# Patient Record
Sex: Female | Born: 1990 | Race: Black or African American | Hispanic: No | Marital: Single | Smoking: Never smoker
Health system: Southern US, Community
[De-identification: ages and names within clinical notes are randomized; demographics above are authoritative.]

## PROBLEM LIST (undated history)

## (undated) DIAGNOSIS — D649 Anemia, unspecified: Secondary | ICD-10-CM

## (undated) DIAGNOSIS — G43909 Migraine, unspecified, not intractable, without status migrainosus: Secondary | ICD-10-CM

## (undated) DIAGNOSIS — D219 Benign neoplasm of connective and other soft tissue, unspecified: Secondary | ICD-10-CM

## (undated) HISTORY — PX: NO PAST SURGERIES: SHX2092

---
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
## 2013-05-14 ENCOUNTER — Emergency Department (INDEPENDENT_AMBULATORY_CARE_PROVIDER_SITE_OTHER)
Admission: EM | Admit: 2013-05-14 | Discharge: 2013-05-14 | Disposition: A | Discharge status: Home / Self Care | Payer: 59 | Source: Home / Self Care | Attending: Emergency Medicine | Admitting: Emergency Medicine

## 2013-05-14 ENCOUNTER — Encounter (HOSPITAL_COMMUNITY): Payer: Self-pay | Admitting: Emergency Medicine

## 2013-05-14 DIAGNOSIS — G43909 Migraine, unspecified, not intractable, without status migrainosus: Secondary | ICD-10-CM | POA: Diagnosis not present

## 2013-05-14 MED ORDER — KETOROLAC TROMETHAMINE 30 MG/ML IJ SOLN
30.0000 mg | Freq: Once | INTRAMUSCULAR | Status: AC
  Administered 2013-05-14: 30 mg via INTRAVENOUS

## 2013-05-14 MED ORDER — SODIUM CHLORIDE 0.9 % IJ SOLN
INTRAMUSCULAR | Status: AC
  Filled 2013-05-14: qty 10

## 2013-05-14 MED ORDER — METOCLOPRAMIDE HCL 5 MG/ML IJ SOLN
INTRAMUSCULAR | Status: AC
  Filled 2013-05-14: qty 2

## 2013-05-14 MED ORDER — DIPHENHYDRAMINE HCL 50 MG/ML IJ SOLN
INTRAMUSCULAR | Status: AC
  Filled 2013-05-14: qty 1

## 2013-05-14 MED ORDER — DIPHENHYDRAMINE HCL 50 MG/ML IJ SOLN
25.0000 mg | Freq: Once | INTRAMUSCULAR | Status: AC
  Administered 2013-05-14: 25 mg via INTRAVENOUS

## 2013-05-14 MED ORDER — ONDANSETRON HCL 4 MG/2ML IJ SOLN: 4.0000 mg | Freq: Four times a day (QID) | INTRAMUSCULAR | Status: DC | PRN

## 2013-05-14 MED ORDER — SODIUM CHLORIDE 0.9 % IV BOLUS (SEPSIS)
1000.0000 mL | Freq: Once | INTRAVENOUS | Status: AC
Start: 2013-05-14 — End: 2013-05-14
  Administered 2013-05-14: 1000 mL via INTRAVENOUS

## 2013-05-14 MED ORDER — ONDANSETRON HCL 4 MG/2ML IJ SOLN
4.0000 mg | Freq: Once | INTRAMUSCULAR | Status: AC
  Administered 2013-05-14: 4 mg via INTRAVENOUS

## 2013-05-14 MED ORDER — METOCLOPRAMIDE HCL 5 MG/ML IJ SOLN
10.0000 mg | Freq: Once | INTRAMUSCULAR | Status: AC
  Administered 2013-05-14: 10 mg via INTRAVENOUS

## 2013-05-14 MED ORDER — KETOROLAC TROMETHAMINE 30 MG/ML IJ SOLN
INTRAMUSCULAR | Status: AC
  Filled 2013-05-14: qty 1

## 2013-05-14 MED ORDER — ONDANSETRON HCL 4 MG PO TABS: 4.0000 mg | ORAL_TABLET | Freq: Four times a day (QID) | ORAL | Status: DC

## 2013-05-14 MED ORDER — ONDANSETRON HCL 4 MG/2ML IJ SOLN
INTRAMUSCULAR | Status: AC
  Filled 2013-05-14: qty 2

## 2013-05-14 NOTE — Discharge Instructions (Signed)
Please review the information below. Rest, drink plenty of liquids. Take the Zofran as directed for nausea. You may want to try the over the counter "Excedrine Migraine" as directed for recurrence of migraine headache. If your migraines worsen or persist we have provided a referral to [facility] so you can arrange follow up for further evaluation and management of your migraine headaches.   Migraine Headache A migraine headache is an intense, throbbing pain on one or both sides of your head. A migraine can last for 30 minutes to several hours. CAUSES  The exact cause of a migraine headache is not always known. However, a migraine may be caused when nerves in the brain become irritated and release chemicals that cause inflammation. This causes pain. Certain things may also trigger migraines, such as:  Alcohol.  Smoking.  Stress.  Menstruation.  Aged cheeses.  Foods or drinks that contain nitrates, glutamate, aspartame, or tyramine.  Lack of sleep.  Chocolate.  Caffeine.  Hunger.  Physical exertion.  Fatigue.  Medicines used to treat chest pain (nitroglycerine), birth control pills, estrogen, and some blood pressure medicines. SIGNS AND SYMPTOMS  Pain on one or both sides of your head.  Pulsating or throbbing pain.  Severe pain that prevents daily activities.  Pain that is aggravated by any physical activity.  Nausea, vomiting, or both.  Dizziness.  Pain with exposure to bright lights, loud noises, or activity.  General sensitivity to bright lights, loud noises, or smells. Before you get a migraine, you may get warning signs that a migraine is coming (aura). An aura may include:  Seeing flashing lights.  Seeing bright spots, halos, or zig-zag lines.  Having tunnel vision or blurred vision.  Having feelings of numbness or tingling.  Having trouble talking.  Having muscle weakness. DIAGNOSIS  A migraine headache is often diagnosed  based on:  Symptoms.  Physical exam.  A CT scan or MRI of your head. These imaging tests cannot diagnose migraines, but they can help rule out other causes of headaches. TREATMENT Medicines may be given for pain and nausea. Medicines can also be given to help prevent recurrent migraines.  HOME CARE INSTRUCTIONS  Only take over-the-counter or prescription medicines for pain or discomfort as directed by your health care provider. The use of long-term narcotics is not recommended.  Lie down in a dark, quiet room when you have a migraine.  Keep a journal to find out what may trigger your migraine headaches. For example, write down:  What you eat and drink.  How much sleep you get.  Any change to your diet or medicines.  Limit alcohol consumption.  Quit smoking if you smoke.  Get 7 9 hours of sleep, or as recommended by your health care provider.  Limit stress.  Keep lights dim if bright lights bother you and make your migraines worse. SEEK IMMEDIATE MEDICAL CARE IF:   Your migraine becomes severe.  You have a fever.  You have a stiff neck.  You have vision loss.  You have muscular weakness or loss of muscle control.  You start losing your balance or have trouble walking.  You feel faint or pass out.  You have severe symptoms that are different from your first symptoms. MAKE SURE YOU:   Understand these instructions.  Will watch your condition.  Will get help right away if you are not doing well or get worse. Document Released: 03/10/2005 Document Revised: 12/29/2012 Document Reviewed: 11/15/2012 [facility] Patient Information 2014 [place].

## 2013-05-14 NOTE — ED Notes (Signed)
C/o migraine which comes and goes States she has had this migraine since Tuesday OTC medication used but no relief.

## 2013-05-17 NOTE — ED Provider Notes (Signed)
CSN: [id number]     Arrival date & time 05/14/13  29 History   First MD Initiated Contact with Patient 05/14/13 1923     Chief Complaint  Patient presents with  . Migraine     Patient is a 23 y.o. female presenting with migraines. The history is provided by the patient.  Migraine This is a recurrent problem. The current episode started more than 2 days ago. The problem occurs constantly. The problem has not changed since onset.Associated symptoms include headaches. Pertinent negatives include no chest pain, no abdominal pain and no shortness of breath. Nothing relieves the symptoms. She has tried a cold compress, rest and acetaminophen for the symptoms. The treatment provided no relief.  Pt reports onset of Migraine h/a on Tuesday that has persisted. The intensity will vary somewhat but no relief w/ OTC meds. In the last 1-2 days pt has had increasing nausea w/ one episode of vomiting and photosensitivity. Pt has had migraine h/a's since childhood and states this h/a is similar. Describes (L) sided h/a that also causes intermittent pain behind her (L) eye. Denies recent URI sx's. Reports h/a currently 810.   Past Medical History  Diagnosis Date  . Migraine headache    History reviewed. No pertinent past surgical history. History reviewed. No pertinent family history. History  Substance Use Topics  . Smoking status: Never Smoker   . Smokeless tobacco: Not on file  . Alcohol Use: No   OB History   Grav Para Term Preterm Abortions TAB SAB Ect Mult Living                 Review of Systems  Constitutional: Positive for fever and chills.  Eyes: Negative.   Respiratory: Negative.  Negative for shortness of breath.   Cardiovascular: Negative.  Negative for chest pain.  Gastrointestinal: Positive for nausea, vomiting and diarrhea. Negative for abdominal pain.  Endocrine: Negative.   Genitourinary: Negative.   Musculoskeletal: Negative.   Skin: Negative.   Allergic/Immunologic:  Negative.   Neurological: Positive for headaches. Negative for dizziness, speech difficulty, light-headedness and numbness.  Hematological: Negative.   Psychiatric/Behavioral: Negative.       Allergies  Review of patient's allergies indicates no known allergies.  Home Medications   Current Outpatient Rx  Name  Route  Sig  Dispense  Refill  . ondansetron (ZOFRAN) 4 MG tablet   Oral   Take 1 tablet (4 mg total) by mouth every 6 (six) hours.   12 tablet   0    BP 115/76  Pulse 63  Temp(Src) 98.2 F (36.8 C) (Oral)  Resp 17  SpO2 100%  LMP 04/07/2013 Physical Exam  Constitutional: She is oriented to person, place, and time. She appears well-developed and well-nourished.  HENT:  Head: Normocephalic and atraumatic.  Eyes: Conjunctivae and EOM are normal. Pupils are equal, round, and reactive to light. Right eye exhibits no discharge. Left eye exhibits no discharge. No scleral icterus.  Neck: Neck supple.  Cardiovascular: Normal rate and regular rhythm.   Pulmonary/Chest: Effort normal and breath sounds normal.  Musculoskeletal: Normal range of motion.  Neurological: She is alert and oriented to person, place, and time. She has normal strength and normal reflexes. No cranial nerve deficit. Coordination normal.  Skin: Skin is warm and dry.  Psychiatric: She has a normal mood and affect.    ED Course  Procedures (including critical care time) Labs Review Labs Reviewed - No data to display Imaging Review No results found.  MDM   Final diagnoses:  Migraine    4 day h/o migraine h/a that has not responded to OTC meds. H/o same. No fever. Exam completely non-focal. Pt received 1L NS w/ Benadryl 25 IV, Reglan 10 mg IV and Toradol 30 mg IV x 1 and reports near complete resolution of h/a (from 8/10 to 1/10). Pt prescribed Zofran for home use and encouraged to f/u w/ [facility] if h/a's persist or worsen Pt agreeable w/ plan.     [place],  NP 05/17/13 [phone]

## 2013-05-17 NOTE — ED Provider Notes (Signed)
Medical screening examination/treatment/procedure(s) were performed by non-physician practitioner and as supervising physician I was immediately available for consultation/collaboration.  Philipp Deputy, M.D.  Harden Mo, MD 05/17/13 [phone]

## 2013-07-06 ENCOUNTER — Other Ambulatory Visit (HOSPITAL_COMMUNITY)
Admission: RE | Admit: 2013-07-06 | Discharge: 2013-07-06 | Disposition: A | Discharge status: Home / Self Care | Payer: 59 | Source: Ambulatory Visit | Attending: Emergency Medicine | Admitting: Emergency Medicine

## 2013-07-06 ENCOUNTER — Emergency Department (INDEPENDENT_AMBULATORY_CARE_PROVIDER_SITE_OTHER)
Admission: EM | Admit: 2013-07-06 | Discharge: 2013-07-06 | Disposition: A | Discharge status: Home / Self Care | Payer: 59 | Source: Home / Self Care | Attending: Emergency Medicine | Admitting: Emergency Medicine

## 2013-07-06 ENCOUNTER — Encounter (HOSPITAL_COMMUNITY): Payer: Self-pay | Admitting: Emergency Medicine

## 2013-07-06 DIAGNOSIS — R109 Unspecified abdominal pain: Secondary | ICD-10-CM

## 2013-07-06 DIAGNOSIS — Z202 Contact with and (suspected) exposure to infections with a predominantly sexual mode of transmission: Secondary | ICD-10-CM

## 2013-07-06 DIAGNOSIS — Z113 Encounter for screening for infections with a predominantly sexual mode of transmission: Secondary | ICD-10-CM | POA: Insufficient documentation

## 2013-07-06 DIAGNOSIS — N760 Acute vaginitis: Secondary | ICD-10-CM | POA: Insufficient documentation

## 2013-07-06 DIAGNOSIS — G43909 Migraine, unspecified, not intractable, without status migrainosus: Secondary | ICD-10-CM

## 2013-07-06 LAB — POCT URINALYSIS DIP (DEVICE)
Bilirubin Urine: NEGATIVE
Glucose, UA: NEGATIVE mg/dL
Hgb urine dipstick: NEGATIVE
Ketones, ur: NEGATIVE mg/dL
Leukocytes, UA: NEGATIVE
Nitrite: NEGATIVE
Protein, ur: NEGATIVE mg/dL
Specific Gravity, Urine: 1.01 (ref 1.005–1.030)
Urobilinogen, UA: 0.2 mg/dL (ref 0.0–1.0)
pH: 5.5 (ref 5.0–8.0)

## 2013-07-06 LAB — POCT PREGNANCY, URINE: Preg Test, Ur: NEGATIVE

## 2013-07-06 MED ORDER — LIDOCAINE HCL (PF) 1 % IJ SOLN
INTRAMUSCULAR | Status: AC
  Filled 2013-07-06: qty 5

## 2013-07-06 MED ORDER — AZITHROMYCIN 250 MG PO TABS
ORAL_TABLET | ORAL | Status: AC
  Filled 2013-07-06: qty 4

## 2013-07-06 MED ORDER — AZITHROMYCIN 250 MG PO TABS
1000.0000 mg | ORAL_TABLET | Freq: Once | ORAL | Status: AC
  Administered 2013-07-06: 1000 mg via ORAL

## 2013-07-06 MED ORDER — CEFTRIAXONE SODIUM 1 G IJ SOLR
1.0000 g | Freq: Once | INTRAMUSCULAR | Status: AC
  Administered 2013-07-06: 1 g via INTRAMUSCULAR

## 2013-07-06 MED ORDER — CEFTRIAXONE SODIUM 1 G IJ SOLR
INTRAMUSCULAR | Status: AC
  Filled 2013-07-06: qty 10

## 2013-07-06 NOTE — Discharge Instructions (Signed)
Patricia Wilkinson,   Nice to meet you. I think that you are having abdominal pain related to your headache. The exact cause of your headache is uncertain, but be cautious about what you eat. Continue to use BC powder as needed. Also, we will treat you for possible gonorrhea and chlamydia and follow up those tests. DO NOT HAVE SEX FOR 7 DAYS! If you develop worsening symptoms, then please return.  Sincerely,  Dr. Maricela Bo
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
## 2013-07-06 NOTE — ED Notes (Signed)
Patient not ready for discharge, patient aware of post injection delay post injection prior to discharge

## 2013-07-06 NOTE — ED Notes (Signed)
Provided ginger ale and graham crackers

## 2013-07-06 NOTE — ED Notes (Signed)
Patient reports onset of symptoms 4/14: abdominal pain, headache, nausea, vomiting, diarrhea.  2 soft stools today, one episode of vomiting today.  Prior to arriving at [facility] today patient ate at [employer] -ate chicken tenders.  Denies vaginal discharge, denies urinary symptoms

## 2013-07-07 ENCOUNTER — Telehealth: Payer: Self-pay | Admitting: Family Medicine

## 2013-07-07 DIAGNOSIS — B9689 Other specified bacterial agents as the cause of diseases classified elsewhere: Secondary | ICD-10-CM

## 2013-07-07 DIAGNOSIS — N760 Acute vaginitis: Principal | ICD-10-CM

## 2013-07-07 LAB — CERVICOVAGINAL ANCILLARY ONLY
Chlamydia: NEGATIVE
Neisseria Gonorrhea: NEGATIVE
Wet Prep (BD Affirm): NEGATIVE
Wet Prep (BD Affirm): NEGATIVE
Wet Prep (BD Affirm): POSITIVE — AB

## 2013-07-07 MED ORDER — FLUCONAZOLE 150 MG PO TABS: 150.0000 mg | ORAL_TABLET | Freq: Once | ORAL | Status: DC

## 2013-07-07 MED ORDER — METRONIDAZOLE 500 MG PO TABS: 500.0000 mg | ORAL_TABLET | Freq: Three times a day (TID) | ORAL | Status: DC

## 2013-07-07 NOTE — Telephone Encounter (Signed)
Pt called and alerted to BV. Counseled on treatment and prescriptions sent to pharmacy.

## 2013-07-07 NOTE — ED Provider Notes (Signed)
Medical screening examination/treatment/procedure(s) were performed by a resident physician and as supervising physician I was immediately available for consultation/collaboration.  Philipp Deputy, M.D.  Harden Mo, MD 07/07/13 [phone]

## 2013-07-13 NOTE — ED Notes (Signed)
4/16  Gardnerella pos., rest of labs neg.,  Dr. Maricela Bo e-prescribed Flagyl and Diflucan and notified pt. 07/13/2013

## 2013-10-02 ENCOUNTER — Emergency Department (HOSPITAL_COMMUNITY)
Admission: EM | Admit: 2013-10-02 | Discharge: 2013-10-02 | Disposition: A | Discharge status: Home / Self Care | Payer: 59 | Attending: Emergency Medicine | Admitting: Emergency Medicine

## 2013-10-02 ENCOUNTER — Encounter (HOSPITAL_COMMUNITY): Payer: Self-pay | Admitting: Emergency Medicine

## 2013-10-02 ENCOUNTER — Emergency Department (HOSPITAL_COMMUNITY): Payer: 59

## 2013-10-02 DIAGNOSIS — S99929A Unspecified injury of unspecified foot, initial encounter: Principal | ICD-10-CM

## 2013-10-02 DIAGNOSIS — Y92410 Unspecified street and highway as the place of occurrence of the external cause: Secondary | ICD-10-CM | POA: Insufficient documentation

## 2013-10-02 DIAGNOSIS — M79601 Pain in right arm: Secondary | ICD-10-CM

## 2013-10-02 DIAGNOSIS — S99919A Unspecified injury of unspecified ankle, initial encounter: Principal | ICD-10-CM

## 2013-10-02 DIAGNOSIS — S46909A Unspecified injury of unspecified muscle, fascia and tendon at shoulder and upper arm level, unspecified arm, initial encounter: Secondary | ICD-10-CM | POA: Insufficient documentation

## 2013-10-02 DIAGNOSIS — M25561 Pain in right knee: Secondary | ICD-10-CM

## 2013-10-02 DIAGNOSIS — Y9389 Activity, other specified: Secondary | ICD-10-CM | POA: Insufficient documentation

## 2013-10-02 DIAGNOSIS — S8990XA Unspecified injury of unspecified lower leg, initial encounter: Secondary | ICD-10-CM | POA: Insufficient documentation

## 2013-10-02 DIAGNOSIS — Z8669 Personal history of other diseases of the nervous system and sense organs: Secondary | ICD-10-CM | POA: Insufficient documentation

## 2013-10-02 DIAGNOSIS — S4980XA Other specified injuries of shoulder and upper arm, unspecified arm, initial encounter: Secondary | ICD-10-CM | POA: Insufficient documentation

## 2013-10-02 MED ORDER — IBUPROFEN 400 MG PO TABS
800.0000 mg | ORAL_TABLET | Freq: Once | ORAL | Status: AC
  Administered 2013-10-02: 800 mg via ORAL
  Filled 2013-10-02: qty 2

## 2013-10-02 MED ORDER — HYDROCODONE-ACETAMINOPHEN 5-325 MG PO TABS
1.0000 | ORAL_TABLET | ORAL | Status: DC | PRN
Start: 2013-10-02 — End: 2019-08-16

## 2013-10-02 MED ORDER — IBUPROFEN 800 MG PO TABS: 800.0000 mg | ORAL_TABLET | Freq: Three times a day (TID) | ORAL | Status: DC
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
## 2013-10-02 NOTE — ED Notes (Signed)
Declined W/C at D/C and was escorted to lobby by RN. 

## 2013-10-02 NOTE — Discharge Instructions (Signed)
Take the prescribed medication as directed. vicodin may make you somewhat drowsy, use with caution.  you will continue to be sore for the next few days which is normal. Return to the ED for new or worsening symptoms.

## 2013-10-10 NOTE — ED Provider Notes (Signed)
Medical screening examination/treatment/procedure(s) were performed by non-physician practitioner and as supervising physician I was immediately available for consultation/collaboration.   EKG Interpretation None        Tanna Furry, MD 10/10/13 1513

## 2014-05-04 ENCOUNTER — Encounter (HOSPITAL_COMMUNITY): Payer: Self-pay | Admitting: *Deleted

## 2014-05-04 ENCOUNTER — Other Ambulatory Visit (HOSPITAL_COMMUNITY)
Admission: RE | Admit: 2014-05-04 | Discharge: 2014-05-04 | Disposition: A | Discharge status: Home / Self Care | Payer: 59 | Source: Ambulatory Visit | Attending: Family Medicine | Admitting: Family Medicine

## 2014-05-04 ENCOUNTER — Emergency Department (HOSPITAL_COMMUNITY)
Admission: EM | Admit: 2014-05-04 | Discharge: 2014-05-04 | Disposition: A | Discharge status: Home / Self Care | Payer: 59 | Source: Home / Self Care | Attending: Family Medicine | Admitting: Family Medicine

## 2014-05-04 DIAGNOSIS — N760 Acute vaginitis: Secondary | ICD-10-CM

## 2014-05-04 DIAGNOSIS — Z113 Encounter for screening for infections with a predominantly sexual mode of transmission: Secondary | ICD-10-CM | POA: Diagnosis present

## 2014-05-04 DIAGNOSIS — N938 Other specified abnormal uterine and vaginal bleeding: Secondary | ICD-10-CM

## 2014-05-04 DIAGNOSIS — N72 Inflammatory disease of cervix uteri: Secondary | ICD-10-CM

## 2014-05-04 LAB — POCT URINALYSIS DIP (DEVICE)
Bilirubin Urine: NEGATIVE
Glucose, UA: NEGATIVE mg/dL
Ketones, ur: NEGATIVE mg/dL
Nitrite: NEGATIVE
Protein, ur: NEGATIVE mg/dL
Specific Gravity, Urine: 1.015 (ref 1.005–1.030)
Urobilinogen, UA: 0.2 mg/dL (ref 0.0–1.0)
pH: 7 (ref 5.0–8.0)

## 2014-05-04 LAB — POCT PREGNANCY, URINE: Preg Test, Ur: NEGATIVE

## 2014-05-04 MED ORDER — LIDOCAINE HCL (PF) 1 % IJ SOLN
INTRAMUSCULAR | Status: AC
  Filled 2014-05-04: qty 5

## 2014-05-04 MED ORDER — AZITHROMYCIN 250 MG PO TABS
ORAL_TABLET | ORAL | Status: AC
  Filled 2014-05-04: qty 4

## 2014-05-04 MED ORDER — CEFTRIAXONE SODIUM 250 MG IJ SOLR
INTRAMUSCULAR | Status: AC
  Filled 2014-05-04: qty 250

## 2014-05-04 MED ORDER — AZITHROMYCIN 250 MG PO TABS
1000.0000 mg | ORAL_TABLET | Freq: Once | ORAL | Status: AC
  Administered 2014-05-04: 1000 mg via ORAL

## 2014-05-04 MED ORDER — CEFTRIAXONE SODIUM 250 MG IJ SOLR
250.0000 mg | Freq: Once | INTRAMUSCULAR | Status: AC
  Administered 2014-05-04: 250 mg via INTRAMUSCULAR

## 2014-05-04 NOTE — Discharge Instructions (Signed)
Cervicitis Cervicitis is a soreness and swelling (inflammation) of the cervix. Your cervix is located at the bottom of your uterus. It opens up to the vagina. CAUSES   Sexually transmitted infections (STIs).   Allergic reaction.   Medicines or birth control devices that are put in the vagina.   Injury to the cervix.   Bacterial infections.  RISK FACTORS You are at greater risk if you:  Have unprotected sexual intercourse.  Have sexual intercourse with many partners.  Began sexual intercourse at an early age.  Have a history of STIs. SYMPTOMS  There may be no symptoms. If symptoms occur, they may include:   Gray, white, yellow, or bad-smelling vaginal discharge.   Pain or itching of the area outside the vagina.   Painful sexual intercourse.   Lower abdominal or lower back pain, especially during intercourse.   Frequent urination.   Abnormal vaginal bleeding between periods, after sexual intercourse, or after menopause.   Pressure or a heavy feeling in the pelvis.  DIAGNOSIS  Diagnosis is made after a pelvic exam. Other tests may include:   Examination of any discharge under a microscope (wet prep).   A Pap test.  TREATMENT  Treatment will depend on the cause of cervicitis. If it is caused by an STI, both you and your partner will need to be treated. Antibiotic medicines will be given.  HOME CARE INSTRUCTIONS   Do not have sexual intercourse until your health care provider says it is okay.   Do not have sexual intercourse until your partner has been treated, if your cervicitis is caused by an STI.   Take your antibiotics as directed. Finish them even if you start to feel better.  SEEK MEDICAL CARE IF:  Your symptoms come back.   You have a fever.  MAKE SURE YOU:   Understand these instructions.  Will watch your condition.  Will get help right away if you are not doing well or get worse. Document Released: 03/10/2005 Document Revised:  03/15/2013 Document Reviewed: 09/01/2012 [facility] Patient Information 2015 [place], [place]. This information is not intended to replace advice given to you by your health care provider. Make sure you discuss any questions you have with your health care provider.  Abnormal Uterine Bleeding Abnormal uterine bleeding can affect women at various stages in life, including teenagers, women in their reproductive years, pregnant women, and women who have reached menopause. Several kinds of uterine bleeding are considered abnormal, including:  Bleeding or spotting between periods.   Bleeding after sexual intercourse.   Bleeding that is heavier or more than normal.   Periods that last longer than usual.  Bleeding after menopause.  Many cases of abnormal uterine bleeding are minor and simple to treat, while others are more serious. Any type of abnormal bleeding should be evaluated by your health care provider. Treatment will depend on the cause of the bleeding. HOME CARE INSTRUCTIONS Monitor your condition for any changes. The following actions may help to alleviate any discomfort you are experiencing:  Avoid the use of tampons and douches as directed by your health care provider.  Change your pads frequently. You should get regular pelvic exams and Pap tests. Keep all follow-up appointments for diagnostic tests as directed by your health care provider.  SEEK MEDICAL CARE IF:   Your bleeding lasts more than 1 week.   You feel dizzy at times.  SEEK IMMEDIATE MEDICAL CARE IF:   You pass out.   You are changing pads every 15  to 30 minutes.   You have abdominal pain.  You have a fever.   You become sweaty or weak.   You are passing large blood clots from the vagina.   You start to feel nauseous and vomit. MAKE SURE YOU:   Understand these instructions.  Will watch your condition.  Will get help right away if you are not doing well or get worse. Document Released:  03/10/2005 Document Revised: 03/15/2013 Document Reviewed: 10/07/2012 [facility] Patient Information 2015 [place], [place]. This information is not intended to replace advice given to you by your health care provider. Make sure you discuss any questions you have with your health care provider.  Vaginitis Vaginitis is an inflammation of the vagina. It is most often caused by a change in the normal balance of the bacteria and yeast that live in the vagina. This change in balance causes an overgrowth of certain bacteria or yeast, which causes the inflammation. There are different types of vaginitis, but the most common types are:  Bacterial vaginosis.  Yeast infection (candidiasis).  Trichomoniasis vaginitis. This is a sexually transmitted infection (STI).  Viral vaginitis.  Atropic vaginitis.  Allergic vaginitis. CAUSES  The cause depends on the type of vaginitis. Vaginitis can be caused by:  Bacteria (bacterial vaginosis).  Yeast (yeast infection).  A parasite (trichomoniasis vaginitis)  A virus (viral vaginitis).  Low hormone levels (atrophic vaginitis). Low hormone levels can occur during pregnancy, breastfeeding, or after menopause.  Irritants, such as bubble baths, scented tampons, and feminine sprays (allergic vaginitis). Other factors can change the normal balance of the yeast and bacteria that live in the vagina. These include:  Antibiotic medicines.  Poor hygiene.  Diaphragms, vaginal sponges, spermicides, birth control pills, and intrauterine devices (IUD).  Sexual intercourse.  Infection.  Uncontrolled diabetes.  A weakened immune system. SYMPTOMS  Symptoms can vary depending on the cause of the vaginitis. Common symptoms include:  Abnormal vaginal discharge.  The discharge is white, gray, or yellow with bacterial vaginosis.  The discharge is thick, white, and cheesy with a yeast infection.  The discharge is frothy and yellow or greenish with  trichomoniasis.  A bad vaginal odor.  The odor is fishy with bacterial vaginosis.  Vaginal itching, pain, or swelling.  Painful intercourse.  Pain or burning when urinating. Sometimes, there are no symptoms. TREATMENT  Treatment will vary depending on the type of infection.   Bacterial vaginosis and trichomoniasis are often treated with antibiotic creams or pills.  Yeast infections are often treated with antifungal medicines, such as vaginal creams or suppositories.  Viral vaginitis has no cure, but symptoms can be treated with medicines that relieve discomfort. Your sexual partner should be treated as well.  Atrophic vaginitis may be treated with an estrogen cream, pill, suppository, or vaginal ring. If vaginal dryness occurs, lubricants and moisturizing creams may help. You may be told to avoid scented soaps, sprays, or douches.  Allergic vaginitis treatment involves quitting the use of the product that is causing the problem. Vaginal creams can be used to treat the symptoms. HOME CARE INSTRUCTIONS   Take all medicines as directed by your caregiver.  Keep your genital area clean and dry. Avoid soap and only rinse the area with water.  Avoid douching. It can remove the healthy bacteria in the vagina.  Do not use tampons or have sexual intercourse until your vaginitis has been treated. Use sanitary pads while you have vaginitis.  Wipe from front to back. This avoids the spread of bacteria  from the rectum to the vagina.  Let air reach your genital area.  Wear cotton underwear to decrease moisture buildup.  Avoid wearing underwear while you sleep until your vaginitis is gone.  Avoid tight pants and underwear or nylons without a cotton panel.  Take off wet clothing (especially bathing suits) as soon as possible.  Use mild, non-scented products. Avoid using irritants, such as:  Scented feminine sprays.  Fabric softeners.  Scented detergents.  Scented  tampons.  Scented soaps or bubble baths.  Practice safe sex and use condoms. Condoms may prevent the spread of trichomoniasis and viral vaginitis. SEEK MEDICAL CARE IF:   You have abdominal pain.  You have a fever or persistent symptoms for more than 2-3 days.  You have a fever and your symptoms suddenly get worse. Document Released: 01/05/2007 Document Revised: 12/03/2011 Document Reviewed: 08/21/2011 [facility] [facility] Patient Information 2015 [place], [place]. This information is not intended to replace advice given to you by your health care provider. Make sure you discuss any questions you have with your health care provider.

## 2014-05-04 NOTE — ED Notes (Signed)
Pt  Reports   Low  abd   Pressure  As  Well  As  irreg  Menses     Ambulated to  Room  With a  Steady  Fluid  Gait

## 2014-05-04 NOTE — ED Provider Notes (Signed)
CSN: [id number]     Arrival date & time 05/04/14  1316 History   First MD Initiated Contact with Patient 05/04/14 1352     Chief Complaint  Patient presents with  . Menstrual Problem   (Consider location/radiation/quality/duration/timing/severity/associated sxs/prior Treatment) HPI Comments: Patient presents for painless irregular vaginal bleeding. States her LNMP finished on 04/29/2014 and she then began experiencing vaginal bleeding in the amount equivalent to a heavy menses over the past 24 hours. Is sexually active and has new partner as of about 4-6 weeks ago. Does endorse some new vaginal discharge prior to bleeding.  Reports herself to be otherwise healthy No previous episodes.  G0P0   The history is provided by the patient.    Past Medical History  Diagnosis Date  . Migraine headache    History reviewed. No pertinent past surgical history. History reviewed. No pertinent family history. History  Substance Use Topics  . Smoking status: Never Smoker   . Smokeless tobacco: Not on file  . Alcohol Use: No   OB History    No data available     Review of Systems  All other systems reviewed and are negative.   Allergies  Review of patient's allergies indicates no known allergies.  Home Medications   Prior to Admission medications   Medication Sig Start Date End Date Taking? Authorizing Provider  HYDROcodone-acetaminophen (NORCO/VICODIN) 5-325 MG per tablet Take 1 tablet by mouth every 4 (four) hours as needed. 10/02/13   Larene Pickett, PA-C  ibuprofen (ADVIL,MOTRIN) 800 MG tablet Take 1 tablet (800 mg total) by mouth 3 (three) times daily. 10/02/13   Larene Pickett, PA-C   BP 100/60 mmHg  Pulse 88  Temp(Src) 98.6 F (37 C) (Oral)  Resp 20  SpO2 100%  LMP  Physical Exam  Constitutional: She is oriented to person, place, and time. She appears well-developed and well-nourished. No distress.  HENT:  Head: Normocephalic and atraumatic.  Eyes: Conjunctivae are normal.  No scleral icterus.  Cardiovascular: Normal rate.   Pulmonary/Chest: Effort normal.  Genitourinary: Uterus normal. Pelvic exam was performed with patient supine. There is no rash, tenderness or lesion on the right labia. There is no rash, tenderness or lesion on the left labia. Cervix exhibits friability. Cervix exhibits no motion tenderness and no discharge. Right adnexum displays no mass and no tenderness. Left adnexum displays no mass, no tenderness and no fullness. No erythema or tenderness in the vagina. No foreign body around the vagina. No signs of injury around the vagina. Vaginal discharge found.  Thin yellow slightly bloody discharge. No active bleeding. Cervix excoriated and friable  Neurological: She is alert and oriented to person, place, and time.  Skin: Skin is warm and dry.  Psychiatric: She has a normal mood and affect. Her behavior is normal.  Nursing note and vitals reviewed.   ED Course  Procedures (including critical care time) Labs Review Labs Reviewed  POCT URINALYSIS DIP (DEVICE) - Abnormal; Notable for the following:    Hgb urine dipstick MODERATE (*)    Leukocytes, UA SMALL (*)    All other components within normal limits  POCT PREGNANCY, URINE  CERVICOVAGINAL ANCILLARY ONLY    Imaging Review No results found.   MDM   1. Vaginitis   2. Dysfunctional uterine bleeding   3. Cervicitis    Patient treated empirically for gonorrhea and chlamydia with ceftriaxone 250mg  IM and azithromycin 1000mg  po.  Patient advised that she does have an [place] provider and is scheduled for  visit on 05/15/2014. Advised to follow up.  Advised that is lab results indicate need for additional treatment, she will be notified by phone.     Lutricia Feil, [place] 05/04/14 [phone]

## 2014-05-05 LAB — CERVICOVAGINAL ANCILLARY ONLY
Chlamydia: NEGATIVE
Neisseria Gonorrhea: NEGATIVE

## 2014-05-08 LAB — CERVICOVAGINAL ANCILLARY ONLY
Wet Prep (BD Affirm): NEGATIVE
Wet Prep (BD Affirm): NEGATIVE
Wet Prep (BD Affirm): NEGATIVE

## 2014-05-15 ENCOUNTER — Other Ambulatory Visit (HOSPITAL_COMMUNITY)
Admission: RE | Admit: 2014-05-15 | Discharge: 2014-05-15 | Disposition: A | Discharge status: Home / Self Care | Payer: 59 | Source: Ambulatory Visit | Attending: Obstetrics & Gynecology | Admitting: Obstetrics & Gynecology

## 2014-05-15 ENCOUNTER — Other Ambulatory Visit: Payer: Self-pay | Admitting: Obstetrics & Gynecology

## 2014-05-15 DIAGNOSIS — Z01419 Encounter for gynecological examination (general) (routine) without abnormal findings: Secondary | ICD-10-CM | POA: Insufficient documentation

## 2014-05-16 LAB — CYTOLOGY - PAP

## 2015-11-28 ENCOUNTER — Other Ambulatory Visit (HOSPITAL_COMMUNITY)
Admission: RE | Admit: 2015-11-28 | Discharge: 2015-11-28 | Disposition: A | Discharge status: Home / Self Care | Payer: 59 | Source: Ambulatory Visit | Attending: Obstetrics & Gynecology | Admitting: Obstetrics & Gynecology

## 2015-11-28 ENCOUNTER — Other Ambulatory Visit: Payer: Self-pay | Admitting: Obstetrics & Gynecology

## 2015-11-28 DIAGNOSIS — Z113 Encounter for screening for infections with a predominantly sexual mode of transmission: Secondary | ICD-10-CM | POA: Diagnosis present

## 2015-11-28 DIAGNOSIS — Z01419 Encounter for gynecological examination (general) (routine) without abnormal findings: Secondary | ICD-10-CM | POA: Insufficient documentation

## 2015-11-29 LAB — CYTOLOGY - PAP

## 2016-06-08 ENCOUNTER — Emergency Department (HOSPITAL_COMMUNITY)
Admission: EM | Admit: 2016-06-08 | Discharge: 2016-06-09 | Disposition: A | Discharge status: Home / Self Care | Payer: 59 | Attending: Emergency Medicine | Admitting: Emergency Medicine

## 2016-06-08 ENCOUNTER — Encounter (HOSPITAL_COMMUNITY): Payer: Self-pay | Admitting: Obstetrics and Gynecology

## 2016-06-08 DIAGNOSIS — J029 Acute pharyngitis, unspecified: Secondary | ICD-10-CM | POA: Diagnosis not present

## 2016-06-08 LAB — RAPID STREP SCREEN (MED CTR MEBANE ONLY): Streptococcus, Group A Screen (Direct): NEGATIVE

## 2016-06-08 MED ORDER — LIDOCAINE VISCOUS 2 % MT SOLN: 15.0000 mL | OROMUCOSAL | 0 refills | Status: DC | PRN

## 2016-06-08 NOTE — ED Provider Notes (Signed)
[place] DEPT Provider Note    By signing my name below, I, Bea Graff, attest that this documentation has been prepared under the direction and in the presence of Laura Radilla, PA-C. Electronically Signed: Bea Graff, ED Scribe. 06/08/16. 11:11 PM.  History   Chief Complaint Chief Complaint  Patient presents with  . Sore Throat   The history is provided by the patient and medical records. No language interpreter was used.    Patricia Wilkinson is a 26 y.o. female who presents to the Emergency Department complaining of a worsening, severe sore throat that began one week ago. She reports associated feeling of swelling and dryness in her throat.  She reports some hoarseness and states it is difficult to breath (only at night) and swallow. She denies fever, chills, fatigue, cough, nausea, vomiting, diarrhea, drooling, sinus pressure/pain, or rash. She reports some post nasal drip as well. She rates the pain at 9/10. She has taken Mucinex and a mouthwash she received from her mother for pain with minimal relief. She has been able to eat and drink without difficulty. She states she had nasal congestion/rhinorrhea two weeks ago that resolved prior to the sore throat. She reports sick contacts stating her boyfriend recently had a URI and her mother had allergy symptoms.   She reports h/o tonsillitis and migraines. She denies any previous surgeries of the head/neck. She denies allergies to any medications. She is not a smoker but reports drinking alcoholic beverages about 3 times weekly.  Past Medical History:  Diagnosis Date  . Migraine headache    Patient Active Problem List   Diagnosis Date Noted  . Migraine headache    No past surgical history on file.  OB History    No data available     Home Medications    Prior to Admission medications   Medication Sig Start Date End Date Taking? Authorizing Provider  HYDROcodone-acetaminophen (NORCO/VICODIN) 5-325 MG per tablet  Take 1 tablet by mouth every 4 (four) hours as needed. 10/02/13   Larene Pickett, PA-C  ibuprofen (ADVIL,MOTRIN) 600 MG tablet Take 1 tablet (600 mg total) by mouth every 6 (six) hours as needed. 06/08/16   Sherron Mummert Ophelia Charter, PA-C  lidocaine (XYLOCAINE) 2 % solution Use as directed 15 mLs in the mouth or throat every 3 (three) hours as needed for mouth pain. 06/08/16   Iann Rodier Ophelia Charter, PA-C   Family History Family History  Problem Relation Age of Onset  . Migraines Mother   . Diabetes Other   . Cancer Other   . Stroke Other     Social History Social History  Substance Use Topics  . Smoking status: Never Smoker  . Smokeless tobacco: Not on file  . Alcohol use 1.8 oz/week    3 Glasses of wine per week     Allergies   Patient has no known allergies.   Review of Systems Review of Systems  Constitutional: Negative for chills and fever.  HENT: Positive for postnasal drip, sore throat, trouble swallowing and voice change. Negative for congestion, drooling, ear pain, sinus pain and sinus pressure.   Respiratory: Negative for cough.   Gastrointestinal: Negative for diarrhea, nausea and vomiting.   Physical Exam Updated Vital Signs BP 127/81 (BP Location: Right Arm)   Pulse 79   Temp 98.1 F (36.7 C) (Oral)   Resp 16   Ht 5\' 6"  (1.676 m)   Wt 219 lb (99.3 kg)   LMP 06/08/2016 (Exact Date)   SpO2 99%  BMI 35.35 kg/m   Physical Exam  Constitutional: She is oriented to person, place, and time. She appears well-developed and well-nourished.  HENT:  Head: Normocephalic and atraumatic.  Nose: Rhinorrhea present. No nasal deformity.  Mouth/Throat: Uvula is midline. Posterior oropharyngeal edema and posterior oropharyngeal erythema present. No tonsillar abscesses. Tonsils are 2+ on the right. Tonsils are 2+ on the left. Tonsillar exudate.  Eyes: Conjunctivae are normal.  Neck: Normal range of motion. Neck supple. No tracheal deviation present. No thyromegaly present.    Cardiovascular: Normal rate, regular rhythm and normal heart sounds.  Exam reveals no gallop and no friction rub.   No murmur heard. Pulmonary/Chest: Effort normal. No stridor. No respiratory distress. She has no wheezes. She has no rales.  Musculoskeletal: Normal range of motion.  Neurological: She is alert and oriented to person, place, and time.  Skin: Skin is warm and dry. No rash noted.  Psychiatric: She has a normal mood and affect. Her behavior is normal.  Nursing note and vitals reviewed.  ED Treatments / Results  DIAGNOSTIC STUDIES: Oxygen Saturation is 99% on RA, normal by my interpretation.   COORDINATION OF CARE: 10:06 PM- Will order rapid strep test. Pt verbalizes understanding and agrees to plan.  Medications - No data to display  Labs (all labs ordered are listed, but only abnormal results are displayed) Labs Reviewed  RAPID STREP SCREEN (NOT AT [facility])  CULTURE, GROUP A STREP [facility])   EKG  EKG Interpretation None      Radiology No results found.  Procedures Procedures (including critical care time)  Medications Ordered in ED Medications - No data to display  Initial Impression / Assessment and Plan / ED Course  I have reviewed the triage vital signs and the nursing notes.  Pertinent labs & imaging results that were available during my care of the patient were reviewed by me and considered in my medical decision making (see chart for details).     26 y.o. female with worsening sore throat x1 week with difficulty swallowing, The patient is afebrile, normotensive, and not tachycardic. NAD and non-toxic appearing.  No peritonsillar abscess. No meningeal signs. No risk factors for mononucleosis. Rapid strep obtained and negative. Low suspicion for retropharyngeal/deep space tissue abscess; no trismus, drooling, stridor, or "hot potato voice".  Will discharge with symptomatic treatment. Discussed at length reasons for follow-up to the ED. The patient  acknowledges the plan and is agreeable at this time.   I personally performed the services described in this documentation, which was scribed in my presence. The recorded information has been reviewed and is accurate.  Tre Sanker, PA-C, MSPA  Final Clinical Impressions(s) / ED Diagnoses   Final diagnoses:  Viral pharyngitis    New Prescriptions Discharge Medication List as of 06/08/2016 11:41 PM    START taking these medications   Details  lidocaine (XYLOCAINE) 2 % solution Use as directed 15 mLs in the mouth or throat every 3 (three) hours as needed for mouth pain., Starting Sun 06/08/2016, Print         Shogo Larkey Ophelia Charter, PA-C 06/09/16 0016    Orpah Greek, MD 06/09/16 [phone]

## 2016-06-08 NOTE — ED Triage Notes (Signed)
Pt presents to the ED with complaint of difficulty swallowing and throat pain. She states it has gotten worse and that no medicines have relieved it. Pt has migraines and was diagnosed with tension headaches recently at her local urgent care. Pt was concerned because aneurisms run in her family so she wanted to mention the headaches.

## 2016-06-09 MED ORDER — IBUPROFEN 600 MG PO TABS: 600.0000 mg | ORAL_TABLET | Freq: Four times a day (QID) | ORAL | 0 refills | Status: DC | PRN

## 2016-06-09 NOTE — ED Notes (Signed)
Patient Alert and oriented X4. Stable and ambulatory. Patient verbalized understanding of the discharge instructions.  Patient belongings were taken by the patient.  

## 2016-06-10 LAB — CULTURE, GROUP A STREP (THRC)

## 2018-04-05 ENCOUNTER — Emergency Department (HOSPITAL_COMMUNITY): Payer: 59

## 2018-04-05 ENCOUNTER — Other Ambulatory Visit: Payer: Self-pay

## 2018-04-05 ENCOUNTER — Emergency Department (HOSPITAL_COMMUNITY)
Admission: EM | Admit: 2018-04-05 | Discharge: 2018-04-05 | Disposition: A | Discharge status: Home / Self Care | Payer: 59 | Attending: Emergency Medicine | Admitting: Emergency Medicine

## 2018-04-05 ENCOUNTER — Encounter (HOSPITAL_COMMUNITY): Payer: Self-pay | Admitting: Emergency Medicine

## 2018-04-05 DIAGNOSIS — Z23 Encounter for immunization: Secondary | ICD-10-CM | POA: Diagnosis not present

## 2018-04-05 DIAGNOSIS — S61216A Laceration without foreign body of right little finger without damage to nail, initial encounter: Secondary | ICD-10-CM | POA: Insufficient documentation

## 2018-04-05 DIAGNOSIS — S299XXA Unspecified injury of thorax, initial encounter: Secondary | ICD-10-CM | POA: Diagnosis not present

## 2018-04-05 DIAGNOSIS — Y92410 Unspecified street and highway as the place of occurrence of the external cause: Secondary | ICD-10-CM | POA: Diagnosis not present

## 2018-04-05 DIAGNOSIS — Y939 Activity, unspecified: Secondary | ICD-10-CM | POA: Insufficient documentation

## 2018-04-05 DIAGNOSIS — Y998 Other external cause status: Secondary | ICD-10-CM | POA: Insufficient documentation

## 2018-04-05 MED ORDER — BACITRACIN-NEOMYCIN-POLYMYXIN 400-5-5000 EX OINT
TOPICAL_OINTMENT | Freq: Once | CUTANEOUS | Status: AC
  Administered 2018-04-05: 19:00:00 via TOPICAL
  Filled 2018-04-05: qty 1

## 2018-04-05 MED ORDER — TETANUS-DIPHTH-ACELL PERTUSSIS 5-2.5-18.5 LF-MCG/0.5 IM SUSP
0.5000 mL | Freq: Once | INTRAMUSCULAR | Status: AC
  Administered 2018-04-05: 0.5 mL via INTRAMUSCULAR
  Filled 2018-04-05: qty 0.5
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
## 2018-04-05 NOTE — ED Notes (Signed)
This RN wrapped pt's finger with clean gauze and applied bacitracin

## 2018-04-05 NOTE — ED Notes (Signed)
Patient verbalizes understanding of discharge instructions. Opportunity for questioning and answers were provided. Armband removed by staff, pt discharged from ED.  

## 2018-04-05 NOTE — ED Notes (Signed)
Patient transported to X-ray 

## 2018-04-05 NOTE — ED Notes (Signed)
Pt. returned from XR. 
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
## 2018-04-05 NOTE — ED Notes (Signed)
Pt refused Chest XR when transport tech arrived. MD informed.

## 2018-04-05 NOTE — Discharge Instructions (Signed)
Remove the bandage on your little finger tomorrow and wash the wound daily with soap and water and place a thin layer of bacitracin ointment over the wound and then cover with a Band-Aid.  Watch for signs of infection to include redness around the wound, drainage from the wound, more pain, more swelling or fever.  If you think you may be developing an infection, see your doctor or an urgent care center or return to the emergency department if concern for any reason.  It is okay to take Tylenol as directed for pain

## 2018-10-28 ENCOUNTER — Other Ambulatory Visit: Payer: Self-pay

## 2018-10-28 DIAGNOSIS — Z20822 Contact with and (suspected) exposure to covid-19: Secondary | ICD-10-CM

## 2018-10-29 LAB — SPECIMEN STATUS REPORT

## 2018-10-29 LAB — NOVEL CORONAVIRUS, NAA: SARS-CoV-2, NAA: DETECTED — AB

## 2019-06-23 ENCOUNTER — Ambulatory Visit: Payer: Self-pay | Attending: Internal Medicine

## 2019-06-23 DIAGNOSIS — Z23 Encounter for immunization: Secondary | ICD-10-CM

## 2019-06-23 NOTE — Progress Notes (Signed)
   Covid-19 Vaccination Clinic  Name:  Patricia Wilkinson    MRN: [id number] DOB: 1990-06-04  06/23/2019  [name] was observed post Covid-19 immunization for 15 minutes without incident. She was provided with Vaccine Information Sheet and instruction to access the V-Safe system.   [name] was instructed to call 911 with any severe reactions post vaccine: Marland Kitchen Difficulty breathing  . Swelling of face and throat  . A fast heartbeat  . A bad rash all over body  . Dizziness and weakness   Immunizations Administered    Name Date Dose VIS Date Route   Pfizer COVID-19 Vaccine 06/23/2019  1:15 PM 0.3 mL 03/04/2019 Intramuscular   Manufacturer: [employer]   Lot: [id number]   [name]: [id number]

## 2019-07-18 ENCOUNTER — Ambulatory Visit: Payer: Self-pay | Attending: Internal Medicine

## 2019-07-18 DIAGNOSIS — Z23 Encounter for immunization: Secondary | ICD-10-CM

## 2019-07-18 NOTE — Progress Notes (Signed)
   Covid-19 Vaccination Clinic  Name:  ASHAKI FEBRES    MRN: [id number] DOB: 11-01-1990  07/18/2019  Ms. Hasberry was observed post Covid-19 immunization for 15 minutes without incident. She was provided with Vaccine Information Sheet and instruction to access the V-Safe system.   Ms. Crumpton was instructed to call 911 with any severe reactions post vaccine: Marland Kitchen Difficulty breathing  . Swelling of face and throat  . A fast heartbeat  . A bad rash all over body  . Dizziness and weakness   Immunizations Administered    Name Date Dose VIS Date Route   Pfizer COVID-19 Vaccine 07/18/2019  3:33 PM 0.3 mL 05/18/2018 Intramuscular   Manufacturer: [place]   Lot: [id number]   [place]: [id number]

## 2019-08-10 ENCOUNTER — Other Ambulatory Visit: Payer: Self-pay | Admitting: Obstetrics & Gynecology

## 2019-08-10 DIAGNOSIS — O039 Complete or unspecified spontaneous abortion without complication: Secondary | ICD-10-CM

## 2019-08-16 ENCOUNTER — Inpatient Hospital Stay (HOSPITAL_COMMUNITY)
Admission: AD | Admit: 2019-08-16 | Discharge: 2019-08-16 | Disposition: A | Discharge status: Home / Self Care | Payer: BC Managed Care – PPO | Attending: Obstetrics & Gynecology | Admitting: Obstetrics & Gynecology

## 2019-08-16 ENCOUNTER — Inpatient Hospital Stay (HOSPITAL_COMMUNITY): Payer: BC Managed Care – PPO

## 2019-08-16 ENCOUNTER — Other Ambulatory Visit: Payer: Self-pay

## 2019-08-16 ENCOUNTER — Encounter (HOSPITAL_COMMUNITY): Payer: Self-pay | Admitting: Obstetrics & Gynecology

## 2019-08-16 DIAGNOSIS — O4690 Antepartum hemorrhage, unspecified, unspecified trimester: Secondary | ICD-10-CM

## 2019-08-16 DIAGNOSIS — O200 Threatened abortion: Secondary | ICD-10-CM

## 2019-08-16 DIAGNOSIS — Z8249 Family history of ischemic heart disease and other diseases of the circulatory system: Secondary | ICD-10-CM | POA: Diagnosis not present

## 2019-08-16 DIAGNOSIS — Z3A10 10 weeks gestation of pregnancy: Secondary | ICD-10-CM | POA: Insufficient documentation

## 2019-08-16 DIAGNOSIS — N939 Abnormal uterine and vaginal bleeding, unspecified: Secondary | ICD-10-CM | POA: Diagnosis present

## 2019-08-16 DIAGNOSIS — Z6790 Unspecified blood type, Rh positive: Secondary | ICD-10-CM

## 2019-08-16 HISTORY — DX: Anemia, unspecified: D64.9

## 2019-08-16 HISTORY — DX: Benign neoplasm of connective and other soft tissue, unspecified: D21.9

## 2019-08-16 LAB — CBC
HCT: 36.6 % (ref 36.0–46.0)
Hemoglobin: 11.5 g/dL — ABNORMAL LOW (ref 12.0–15.0)
MCH: 24.3 pg — ABNORMAL LOW (ref 26.0–34.0)
MCHC: 31.4 g/dL (ref 30.0–36.0)
MCV: 77.4 fL — ABNORMAL LOW (ref 80.0–100.0)
Platelets: 223 10*3/uL (ref 150–400)
RBC: 4.73 MIL/uL (ref 3.87–5.11)
RDW: 16.4 % — ABNORMAL HIGH (ref 11.5–15.5)
WBC: 5.1 10*3/uL (ref 4.0–10.5)
nRBC: 0 % (ref 0.0–0.2)

## 2019-08-16 LAB — ABO/RH: ABO/RH(D): O POS

## 2019-08-16 LAB — HCG, QUANTITATIVE, PREGNANCY: hCG, Beta Chain, Quant, S: 19116 m[IU]/mL — ABNORMAL HIGH (ref <5)

## 2019-08-16 NOTE — MAU Provider Note (Addendum)
History     CSN: [id number]  Arrival date and time: 08/16/19 [id number]   First Provider Initiated Contact with Patient 08/16/19 1047      Chief Complaint  Patient presents with  . Vaginal Bleeding  . Abdominal Pain   29 y.o. G1 @[redacted]w[redacted]d  by LMP sent from office for VB. Reports onset yesterday and became heavier today. Bleeding is not heavy enough for a pad but she sees it when she wipes. Denies abd pain or cramping. She had an [place] on 5/19 which showed an IUGS and YS but no FP and measured 3 weeks behind.   OB History    Gravida  1   Para      Term      Preterm      AB      Living        SAB      TAB      Ectopic      Multiple      Live Births              Past Medical History:  Diagnosis Date  . Anemia   . Fibroid   . Migraine headache     Past Surgical History:  Procedure Laterality Date  . NO PAST SURGERIES      Family History  Problem Relation Age of Onset  . Migraines Mother   . Hypertension Mother   . Fibromyalgia Mother   . Hypertension Father   . Diabetes Other   . Cancer Other   . Stroke Other     Social History   Tobacco Use  . Smoking status: Never Smoker  . Smokeless tobacco: Never Used  Substance Use Topics  . Alcohol use: Not Currently    Alcohol/week: 3.0 standard drinks    Types: 3 Glasses of wine per week  . Drug use: No    Allergies: No Known Allergies  No medications prior to admission.    Review of Systems  Gastrointestinal: Negative for abdominal pain.  Genitourinary: Positive for vaginal bleeding.   Physical Exam   Blood pressure 115/63, pulse 75, temperature 98.4 F (36.9 C), temperature source Oral, resp. rate 16, height 5\' 6"  (1.676 m), weight 98.2 kg, last menstrual period 06/06/2019, SpO2 100 %.  Physical Exam  Nursing note and vitals reviewed. Constitutional: She is oriented to person, place, and time. She appears well-developed and well-nourished. No distress.  HENT:  Head: Normocephalic and  atraumatic.  Cardiovascular: Normal rate.  Respiratory: Effort normal. No respiratory distress.  GI: Soft. She exhibits no distension and no mass. There is no abdominal tenderness. There is no rebound and no guarding.  Genitourinary:    Genitourinary Comments: External: no lesions or erythema Vagina: rugated, pink, moist, small amt bloody discharge, cleared with 1 fox swab Uterus: non enlarged, anteverted, non tender, no CMT Adnexae: no masses, no tenderness left, no tenderness right Cervix closed    Musculoskeletal:        General: Normal range of motion.     Cervical back: Normal range of motion.  Neurological: She is alert and oriented to person, place, and time.  Skin: Skin is warm and dry.  Psychiatric: She has a normal mood and affect.   Results for orders placed or performed during the hospital encounter of 08/16/19 (from the past 24 hour(s))  ABO/Rh     Status: None   Collection Time: 08/16/19 10:45 AM  Result Value Ref Range   ABO/RH(D) O POS  No rh immune globuloin      NOT A RH IMMUNE GLOBULIN CANDIDATE, PT RH POSITIVE Performed at [facility] [address] [place], [place] [address]   CBC     Status: Abnormal   Collection Time: 08/16/19 10:45 AM  Result Value Ref Range   WBC 5.1 4.0 - 10.5 K/uL   RBC 4.73 3.87 - 5.11 MIL/uL   Hemoglobin 11.5 (L) 12.0 - 15.0 g/dL   HCT 36.6 36.0 - 46.0 %   MCV 77.4 (L) 80.0 - 100.0 fL   MCH 24.3 (L) 26.0 - 34.0 pg   MCHC 31.4 30.0 - 36.0 g/dL   RDW 16.4 (H) 11.5 - 15.5 %   Platelets 223 150 - 400 K/uL   nRBC 0.0 0.0 - 0.2 %  hCG, quantitative, pregnancy     Status: Abnormal   Collection Time: 08/16/19 10:45 AM  Result Value Ref Range   hCG, Beta Chain, Quant, S 19,116 (H) <5 mIU/mL   US OB LESS THAN 14 WEEKS WITH OB TRANSVAGINAL  Result Date: 08/16/2019 CLINICAL DATA:  Vaginal bleeding in first trimester of pregnancy, spotting yesterday; LMP 06/06/2019; no quantitative beta HCG for correlation EXAM: OBSTETRIC <14  WK [place] AND TRANSVAGINAL OB US TECHNIQUE: Both transabdominal and transvaginal ultrasound examinations were performed for complete evaluation of the gestation as well as the maternal uterus, adnexal regions, and pelvic cul-de-sac. Transvaginal technique was performed to assess early pregnancy. COMPARISON:  None FINDINGS: Intrauterine gestational sac: Present, single, irregular Yolk sac:  Present, question enlarged for size of gestational sac Embryo:  Not identified Cardiac Activity: N/A Heart Rate: N/A  bpm MSD: 21.5 mm   7 w   0 d CRL:    mm    w    d                  [place] EDC: Subchorionic hemorrhage:  None visualized. Maternal uterus/adnexae: RIGHT ovary measures 4.2 x 2.2 x 2.0 cm and contains a small hemorrhagic corpus luteum. LEFT ovary normal size and morphology, 3.8 x 2.6 x 2.2 cm. No free pelvic fluid or adnexal masses. IMPRESSION: Single gestational sac identified within the uterus though the gestational sac appears irregular and the contained yolk sac appears enlarged for size of gestational sac. No fetal pole identified. Findings are suspicious but not yet definitive for failed pregnancy. Recommend follow-up US in 10-14 days for definitive diagnosis. This recommendation follows SRU consensus guidelines: Diagnostic Criteria for Nonviable Pregnancy Early in the First Trimester. [place] Med 2013KT:048977. Electronically Signed   By: Lavonia Dana M.D.   On: 08/16/2019 12:21   MAU Course  Procedures  MDM US obtained from office: Gem and Korea ordered and reviewed. Some growth noted since last Korea but IUGS irregular, YS enlarged, and S/D discrepancy with LMP. Suspect failed pregnancy but cannot confirm based on results today. Discussed findings with pt. Recommend f/u in office next with [place]. Stable for discharge home.   Assessment and Plan   1. Threatened miscarriage   2. Vaginal bleeding in pregnancy   3. Blood type, Rh positive    Discharge home Follow up at [facility] next  week-office notified by phone and will call pt SAB precautions Pelvic rest May return to work  Allergies as of 08/16/2019   No Known Allergies     Medication List    STOP taking these medications   HYDROcodone-acetaminophen 5-325 MG tablet Commonly known as: NORCO/VICODIN  ibuprofen 600 MG tablet Commonly known as: ADVIL   lidocaine 2 % solution Commonly known as: XYLOCAINE     TAKE these medications   multivitamin-prenatal 27-0.8 MG Tabs tablet Take 1 tablet by mouth daily at 12 noon.        Julianne Handler, CNM 08/16/2019, 2:36 PM

## 2019-08-16 NOTE — MAU Note (Signed)
Had [place] last wk in office, should be about 9 wk and the baby was only measuring 5-6wks.. scheduled for repeat 5/27, started spotting yesterday.  Only seeing it when she pees. Cramping a little bit.

## 2019-08-16 NOTE — Discharge Instructions (Signed)
Threatened Miscarriage  A threatened miscarriage occurs when a woman has vaginal bleeding during the first 20 weeks of pregnancy but the pregnancy has not ended. If you have vaginal bleeding during this time, your health care provider will do tests to make sure you are still pregnant. If the tests show that you are still pregnant and that the developing baby (fetus) inside your uterus is still growing, your condition is considered a threatened miscarriage. A threatened miscarriage does not mean your pregnancy will end, but it does increase the risk of losing your pregnancy (complete miscarriage). What are the causes? The cause of this condition is usually not known. For women who go on to have a complete miscarriage, the most common cause is an abnormal number of chromosomes in the developing baby. Chromosomes are the structures inside cells that hold all of a person's genetic material. What increases the risk? The following lifestyle factors may increase your risk of a miscarriage in early pregnancy:  Smoking.  Drinking excessive amounts of alcohol or caffeine.  Recreational drug use. The following preexisting health conditions may increase your risk of a miscarriage in early pregnancy:  Polycystic ovary syndrome.  Uterine fibroids.  Infections.  Diabetes mellitus. What are the signs or symptoms? Symptoms of this condition include:  Vaginal bleeding.  Mild abdominal pain or cramps. How is this diagnosed? If you have bleeding with or without abdominal pain before 20 weeks of pregnancy, your health care provider will do tests to check whether you are still pregnant. These will include:  Ultrasound. This test uses sound waves to create images of the inside of your uterus. This allows your health care provider to look at your developing baby and other structures, such as your placenta.  Pelvic exam. This is an internal exam of your vagina and cervix.  Measurement of your baby's heart  rate.  Laboratory tests such as blood tests, urine tests, or swabs for infection You may be diagnosed with a threatened miscarriage if:  Ultrasound testing shows that you are still pregnant.  Your baby's heart rate is strong.  A pelvic exam shows that the opening between your uterus and your vagina (cervix) is closed.  Blood tests confirm that you are still pregnant. How is this treated? No treatments have been shown to prevent a threatened miscarriage from going on to a complete miscarriage. However, the right home care is important. Follow these instructions at home:  Get plenty of rest.  Do not have sex or use tampons if you have vaginal bleeding.  Do not douche.  Do not smoke or use recreational drugs.  Do not drink alcohol.  Avoid caffeine.  Keep all follow-up prenatal visits as told by your health care provider. This is important. Contact a health care provider if:  You have light vaginal bleeding or spotting while pregnant.  You have abdominal pain or cramping.  You have a fever. Get help right away if:  You have heavy vaginal bleeding.  You have blood clots coming from your vagina.  You pass tissue from your vagina.  You leak fluid, or you have a gush of fluid from your vagina.  You have severe low back pain or abdominal cramps.  You have fever, chills, and severe abdominal pain. Summary  A threatened miscarriage occurs when a woman has vaginal bleeding during the first 20 weeks of pregnancy but the pregnancy has not ended.  The cause of a threatened miscarriage is usually not known.  Symptoms of this condition may   include vaginal bleeding and mild abdominal pain or cramps.  No treatments have been shown to prevent a threatened miscarriage from going on to a complete miscarriage.  Keep all follow-up prenatal visits as told by your health care provider. This is important. This information is not intended to replace advice given to you by your health  care provider. Make sure you discuss any questions you have with your health care provider. Document Revised: 04/16/2017 Document Reviewed: 06/06/2016 Elsevier Patient Education  2020 [employer]  

## 2019-08-18 ENCOUNTER — Other Ambulatory Visit: Payer: Self-pay

## 2019-08-18 ENCOUNTER — Inpatient Hospital Stay (HOSPITAL_COMMUNITY): Payer: BC Managed Care – PPO

## 2019-08-18 ENCOUNTER — Inpatient Hospital Stay (HOSPITAL_COMMUNITY)
Admission: AD | Admit: 2019-08-18 | Discharge: 2019-08-19 | Disposition: A | Discharge status: Home / Self Care | Payer: BC Managed Care – PPO | Attending: Obstetrics & Gynecology | Admitting: Obstetrics & Gynecology

## 2019-08-18 ENCOUNTER — Encounter (HOSPITAL_COMMUNITY): Payer: Self-pay | Admitting: Obstetrics & Gynecology

## 2019-08-18 ENCOUNTER — Ambulatory Visit: Payer: BC Managed Care – PPO

## 2019-08-18 DIAGNOSIS — O034 Incomplete spontaneous abortion without complication: Secondary | ICD-10-CM | POA: Diagnosis present

## 2019-08-18 DIAGNOSIS — Z3A10 10 weeks gestation of pregnancy: Secondary | ICD-10-CM | POA: Diagnosis not present

## 2019-08-18 LAB — CBC
HCT: 33.2 % — ABNORMAL LOW (ref 36.0–46.0)
Hemoglobin: 10.6 g/dL — ABNORMAL LOW (ref 12.0–15.0)
MCH: 24.7 pg — ABNORMAL LOW (ref 26.0–34.0)
MCHC: 31.9 g/dL (ref 30.0–36.0)
MCV: 77.2 fL — ABNORMAL LOW (ref 80.0–100.0)
Platelets: 228 10*3/uL (ref 150–400)
RBC: 4.3 MIL/uL (ref 3.87–5.11)
RDW: 16.1 % — ABNORMAL HIGH (ref 11.5–15.5)
WBC: 8.6 10*3/uL (ref 4.0–10.5)
nRBC: 0 % (ref 0.0–0.2)

## 2019-08-18 MED ORDER — CYCLOBENZAPRINE HCL 5 MG PO TABS
10.0000 mg | ORAL_TABLET | Freq: Once | ORAL | Status: AC
  Administered 2019-08-18: 10 mg via ORAL
  Filled 2019-08-18: qty 2

## 2019-08-18 NOTE — MAU Note (Signed)
I am in the process of miscarriage and bleeding heavily. Filling a pad within an hour and passing tissue. Heavy bleeding started about 2 hrs. Mild cramping

## 2019-08-18 NOTE — MAU Provider Note (Addendum)
History     CSN: [id number]  Arrival date and time: 08/18/19 2226   First Provider Initiated Contact with Patient 08/18/19 2258      Chief Complaint  Patient presents with  . Vaginal Bleeding   Patricia Wilkinson is a 29 y.o. G1P0 at [redacted]w[redacted]d who receives care at [facility].  She presents today for Vaginal Bleeding in setting of known Failed Pregnancy.  Patient states her bleeding started on Monday, but has gotten heavier today.  Patient reports she is saturating through a pad a hour and passing clots as well as having gushes of fluid.  Patient reports onset of cramping today that she rates a 4-5/10. Patient states she has not taken anything for the cramping stating "they are getting worse as I sit here."  Patient reports that she was seen at [facility] and was given misoprostol, but has not picked up the prescription yet. Patient reports some lightheadedness, but contributes it to the sight of blood.      OB History    Gravida  1   Para      Term      Preterm      AB      Living        SAB      TAB      Ectopic      Multiple      Live Births              Past Medical History:  Diagnosis Date  . Anemia   . Fibroid   . Migraine headache     Past Surgical History:  Procedure Laterality Date  . NO PAST SURGERIES      Family History  Problem Relation Age of Onset  . Migraines Mother   . Hypertension Mother   . Fibromyalgia Mother   . Hypertension Father   . Diabetes Other   . Cancer Other   . Stroke Other     Social History   Tobacco Use  . Smoking status: Never Smoker  . Smokeless tobacco: Never Used  Substance Use Topics  . Alcohol use: Not Currently    Alcohol/week: 3.0 standard drinks    Types: 3 Glasses of wine per week  . Drug use: No    Allergies: No Known Allergies  Medications Prior to Admission  Medication Sig Dispense Refill Last Dose  . Prenatal Vit-Fe Fumarate-FA (MULTIVITAMIN-PRENATAL) 27-0.8 MG TABS tablet Take 1 tablet by  mouth daily at 12 noon.       Review of Systems  Constitutional: Negative for chills and fever.  Gastrointestinal: Positive for abdominal pain (Mild Cramping). Negative for nausea and vomiting.  Genitourinary: Positive for vaginal bleeding. Negative for difficulty urinating and dysuria.  Neurological: Positive for light-headedness (With sight of blood). Negative for dizziness and headaches.   Physical Exam   Blood pressure 121/67, pulse 70, temperature 98.9 F (37.2 C), resp. rate 18, height 5\' 6"  (1.676 m), weight 100.2 kg, last menstrual period 06/06/2019.  Physical Exam  Constitutional: She is oriented to person, place, and time. She appears well-developed and well-nourished. No distress.  HENT:  Head: Normocephalic and atraumatic.  Eyes: Conjunctivae are normal.  Cardiovascular: Normal rate.  Respiratory: Effort normal.  GI: Soft. There is no abdominal tenderness.  Genitourinary:    Vaginal bleeding present.  There is bleeding in the vagina.    Genitourinary Comments: Speculum Exam: -Normal External Genitalia: Non tender, Blood noted on thighs and at introitus.  -Vaginal Vault:  Pink mucosa with good rugae. Moderate amt blood noted in vault with kiwi sized clot that was removed with ring forceps.  Blood removed with 4x4 gauze.  -Cervix:Pink, no lesions, cysts, or polyps.  Appears open. Small bleeding from os with ?POC noted, but unable to grasp with ring forceps.  -Bimanual Exam:  Deferred    Musculoskeletal:        General: Normal range of motion.     Cervical back: Normal range of motion.  Neurological: She is alert and oriented to person, place, and time.  Skin: Skin is warm and dry.  Psychiatric: She has a normal mood and affect. Her behavior is normal.    MAU Course  Procedures Results for orders placed or performed during the hospital encounter of 08/18/19 (from the past 24 hour(s))  CBC     Status: Abnormal   Collection Time: 08/18/19 11:29 PM  Result Value Ref  Range   WBC 8.6 4.0 - 10.5 K/uL   RBC 4.30 3.87 - 5.11 MIL/uL   Hemoglobin 10.6 (L) 12.0 - 15.0 g/dL   HCT 33.2 (L) 36.0 - 46.0 %   MCV 77.2 (L) 80.0 - 100.0 fL   MCH 24.7 (L) 26.0 - 34.0 pg   MCHC 31.9 30.0 - 36.0 g/dL   RDW 16.1 (H) 11.5 - 15.5 %   Platelets 228 150 - 400 K/uL   nRBC 0.0 0.0 - 0.2 %   US OB Transvaginal  Result Date: 08/19/2019 CLINICAL DATA:  Bleeding, known spontaneous abortion EXAM: OBSTETRIC <14 WK ULTRASOUND TECHNIQUE: Transabdominal ultrasound was performed for evaluation of the gestation as well as the maternal uterus and adnexal regions. COMPARISON:  Ultrasound 08/16/2019 FINDINGS: Intrauterine gestational sac: None Yolk sac:  Not Visualized. Embryo:  Not Visualized. Cardiac Activity: Not Visualized. Maternal uterus/adnexae: There is persistent thickening and heterogeneity of the endometrium which measures up to 18 mm at the uterine fundus with some internal color vascularity. A hypoechoic posterior fundal fibroid is noted as well measuring 1.7 x 1.2 x 1.9 cm. No concerning adnexal lesions. Right ovary measures 2.6 x 1.6 x 1.5 cm, left ovary measures 2.4 x 1.8 x 2.0 cm. No free fluid. IMPRESSION: Absence of the previously seen gestational sac is compatible with reportedly known spontaneous abortion. Endometrial thickness with vascularity on color Doppler are findings compatible with retained products of conception in the appropriate clinical setting. Fibroid uterus. Electronically Signed   By: Lovena Le M.D.   On: 08/19/2019 00:20    MDM Pelvic Exam Pain Medication [place] Assessment and Plan  29 year old Vaginal Bleeding Failed Pregnancy  -POC Reviewed. -Exam performed and findings discussed.  -Specimen not suspicious for POC, but due to size will send to pathology. -Will give flexeril for mild cramping. -Will send for [place] and await results.    Maryann Conners 08/18/2019, 10:58 PM   Reassessment (12:24 AM) No IUGS Retained Products  -[place] results reviewed  with patient. -Informed that GS has been passed, but some POC remain. -Reviewed usage of misoprostol for completion of process and offered to give dosage tonight. -Patient declines stating she would like to allow the process to occur naturally.  However, patient states she will take miso in the AM if bleeding continues at current rate. -Informed that her HgB is currently stable at 10.6g/dL, but it is a decrease from Monday of 11.5g/dL. -Patient further informed that she may take misoprostol and still require surgical intervention.  Patient verbalizes understanding.  -Discussed s/s of worsening condition in  setting of bleeding and actively passing clots including syncope or presyncope episodes, dizziness, lightheadedness outside of viewing blood, nausea, or vomiting. -Patient verbalizes understanding and reports "gushes" with urination, but states pain is more concern now.  Patient states pain has worsened despite flexeril dosing. Now 6/10 and was originally 4/10. -Will give Tramadol now. -Instructed to use tylenol at home. -Encouraged to call or return to MAU if symptoms worsen or with the onset of new symptoms. -Reports appt scheduled for next Friday June 4th with [facility]. Instructed to keep, but to call if q/c arise prior to that time. -Discharged to home in stable condition.  Maryann Conners MSN, CNM Advanced Practice Provider, [facility] [place] (1:09 AM)  -Patient requests wheelchair for discharge. -Provider to bedside and patient states wheelchair request due to effects of Tramadol not pain or increased bleeding. -Reiterated need to return to MAU for worsening or onset of new symptoms.  Maryann Conners MSN, CNM Advanced Practice Provider, [facility]

## 2019-08-19 LAB — HCG, QUANTITATIVE, PREGNANCY: hCG, Beta Chain, Quant, S: 14210 m[IU]/mL — ABNORMAL HIGH (ref <5)

## 2019-08-19 MED ORDER — TRAMADOL HCL 50 MG PO TABS
100.0000 mg | ORAL_TABLET | Freq: Once | ORAL | Status: AC
  Administered 2019-08-19: 100 mg via ORAL
  Filled 2019-08-19: qty 2

## 2019-08-19 NOTE — Discharge Instructions (Signed)
Incomplete Miscarriage A miscarriage is the loss of an unborn baby (fetus) before the 20th week of pregnancy. In an incomplete miscarriage, parts of the fetus or placenta (afterbirth) remain in the body. Most miscarriages happen in the first 3 months of pregnancy. Sometimes, it happens before a woman even knows she is pregnant. Having a miscarriage can be an emotional experience. If you have had a miscarriage, talk with your health care provider about any questions you may have about miscarrying, the grieving process, and your future pregnancy plans. What are the causes? This condition may be caused by:  Problems with the genes or chromosomes that make it impossible for the baby to develop normally. These problems are most often the result of random errors that occur early in development, and are not passed from parent to child (not inherited).  Infection of the cervix or uterus.  Conditions that affect hormone balance in the body.  Problems with the cervix, such as the cervix opening and thinning before pregnancy is at term (cervical insufficiency).  Problems with the uterus, such as a uterus with an abnormal shape, fibroids in the uterus, or problems that were present from birth (congenital abnormalities).  Certain medical conditions.  Smoking, drinking alcohol, or using drugs.  Injury (trauma). Many times, the cause of a miscarriage is not known. What are the signs or symptoms? Symptoms of this condition include:  Vaginal bleeding or spotting, with or without cramps or pain.  Pain or cramping in the abdomen or lower back.  Passing fluid, tissue, or blood clots from the vagina. How is this diagnosed? This condition may be diagnosed based on:  A physical exam.  Ultrasound.  Blood tests.  Urine tests. How is this treated? An incomplete miscarriage may be treated with:  Dilation and curettage (D&C). This is a procedure in which the cervix is stretched open and the lining of  the uterus (endometrium) is scraped to remove any remaining tissue from the pregnancy.  Medicines, such as: ? Antibiotic medicine to treat infection. ? Medicine to help any remaining tissue pass out of your uterus. ? Medicine to reduce (contract) the size of the uterus. These medicines may be given if you have a lot of bleeding. If you have Rh negative blood and your baby was Rh positive, you will need a shot of medicine called Rh immunoglobulinto protect future babies from Rh blood problems. "Rh-negative" and "Rh-positive" refer to whether or not the blood has a specific protein found on the surface of red blood cells (Rh factor). Follow these instructions at home: Medicines   Take over-the-counter and prescription medicines only as told by your health care provider.  If you were prescribed antibiotic medicine, take your antibiotic as told by your health care provider. Do not stop taking the antibiotic even if you start to feel better.  Do not take NSAIDs, such as aspirin and ibuprofen, unless approved by your doctor. These medicines can cause bleeding. Activity  Rest as directed. Ask your health care provider what activities are safe for you.  Have someone help with home and family responsibilities during this time. General instructions  Keep track of the number of sanitary pads you use each day and how soaked (saturated) they are. Write down this information.  Monitor the amount of tissue or blood clots that you pass from your vagina. Save any large amounts of tissue for your health care provider to examine.  Do not use tampons, douche, or have sex until your health care provider   approves.  To help you and your partner with the process of grieving, talk with your health care provider or seek counseling to help cope with the pregnancy loss.  When you are ready, meet with your health care provider to discuss important steps you should take for your health, as well as steps to take in  order to have a healthy pregnancy in the future.  Keep all follow-up visits as told by your health care provider. This is important. Where to find more information  The American Congress of Obstetricians and Gynecologists: www.acog.org  U.[place]. Department of Health and Human Services Office of Women's Health: www.womenshealth.gov Contact a health care provider if:  You have a fever or chills.  You have a foul smelling vaginal discharge. Get help right away if:  You have severe cramps or pain in your back or abdomen.  You pass walnut-sized (or larger) blood clots or tissue from your vagina.  You have heavy bleeding, soaking more than 1 regular sanitary pad in an hour.  You become lightheaded or weak.  You pass out.  You have feelings of sadness that take over your thoughts, or you have thoughts of hurting yourself. Summary  In an incomplete miscarriage, parts of the fetus or placenta (afterbirth) remain in the body.  There are multiple treatment options for an incomplete miscarriage, talk to your health care provider about the best option for you.  Follow your health care provider's instructions for follow-up care.  To help you and your partner with the process of grieving, talk with your health care provider or seek counseling to help cope with the pregnancy loss. This information is not intended to replace advice given to you by your health care provider. Make sure you discuss any questions you have with your health care provider. Document Revised: 04/16/2017 Document Reviewed: 04/16/2016 Elsevier Patient Education  2020 Elsevier Inc.  

## 2019-08-19 NOTE — Progress Notes (Signed)
Written and verbal d/c instructions given and understanding voiced. 

## 2019-08-23 LAB — SURGICAL PATHOLOGY

## 2020-05-18 ENCOUNTER — Other Ambulatory Visit: Payer: Self-pay

## 2020-05-18 ENCOUNTER — Encounter (HOSPITAL_COMMUNITY): Payer: Self-pay

## 2020-05-18 ENCOUNTER — Emergency Department (HOSPITAL_COMMUNITY)
Admission: EM | Admit: 2020-05-18 | Discharge: 2020-05-18 | Disposition: A | Discharge status: Home / Self Care | Payer: BC Managed Care – PPO | Attending: Emergency Medicine | Admitting: Emergency Medicine

## 2020-05-18 DIAGNOSIS — R519 Headache, unspecified: Secondary | ICD-10-CM | POA: Diagnosis present

## 2020-05-18 DIAGNOSIS — J0100 Acute maxillary sinusitis, unspecified: Secondary | ICD-10-CM | POA: Insufficient documentation

## 2020-05-18 DIAGNOSIS — J0110 Acute frontal sinusitis, unspecified: Secondary | ICD-10-CM | POA: Insufficient documentation

## 2020-05-18 DIAGNOSIS — H748X3 Other specified disorders of middle ear and mastoid, bilateral: Secondary | ICD-10-CM | POA: Diagnosis not present

## 2020-05-18 DIAGNOSIS — J019 Acute sinusitis, unspecified: Secondary | ICD-10-CM

## 2020-05-18 MED ORDER — AMOXICILLIN-POT CLAVULANATE 875-125 MG PO TABS
1.0000 | ORAL_TABLET | Freq: Two times a day (BID) | ORAL | 0 refills | Status: DC
Start: 2020-05-18 — End: 2020-05-24

## 2020-05-18 MED ORDER — ACETAMINOPHEN 500 MG PO TABS
1000.0000 mg | ORAL_TABLET | Freq: Once | ORAL | Status: AC
  Administered 2020-05-18: 1000 mg via ORAL
  Filled 2020-05-18: qty 2

## 2020-05-18 MED ORDER — KETOROLAC TROMETHAMINE 15 MG/ML IJ SOLN
15.0000 mg | Freq: Once | INTRAMUSCULAR | Status: AC
  Administered 2020-05-18: 15 mg via INTRAMUSCULAR
  Filled 2020-05-18: qty 1

## 2020-05-18 NOTE — ED Triage Notes (Signed)
Pt c/o 9/10 pressure in between eyes and in nose and gives her a HAx3 days. Pt states this started out as crusted eyes and was given medicine for eyes only and has been taking sudafed for the congestion. Pt states tested neg for COVID 2/17. Pt states she gets blurred vision were her eyes get sticky when she is first waking up  The morning. Pt c/o little bit of dizziness, but denies N/V.

## 2020-05-18 NOTE — ED Provider Notes (Signed)
[facility] EMERGENCY DEPARTMENT Provider Note   CSN: [id number] Arrival date & time: 05/18/20  1104     History Chief Complaint  Patient presents with  . pressure in face    Patricia Wilkinson is a 30 y.o. female.  Patient had conjunctivitis 3 days ago was seen by virtual visit given eyedrops.  Inflammation in her eyes is gotten better however she now feels she has sinus inflammation with drainage and pain.  No facial asymmetry no weakness of the face no difficulty with vision.  Headache is moderate.  No fevers.  Had Covid test was negative.  Has tried Sudafed and other over-the-counter cold remedies but no Tylenol or ibuprofen.        Past Medical History:  Diagnosis Date  . Anemia   . Fibroid   . Migraine headache     Patient Active Problem List   Diagnosis Date Noted  . Migraine headache     Past Surgical History:  Procedure Laterality Date  . NO PAST SURGERIES       OB History    Gravida  1   Para      Term      Preterm      AB      Living        SAB      IAB      Ectopic      Multiple      Live Births              Family History  Problem Relation Age of Onset  . Migraines Mother   . Hypertension Mother   . Fibromyalgia Mother   . Hypertension Father   . Diabetes Other   . Cancer Other   . Stroke Other     Social History   Tobacco Use  . Smoking status: Never Smoker  . Smokeless tobacco: Never Used  Vaping Use  . Vaping Use: Some days  . Devices: CBD  Substance Use Topics  . Alcohol use: Not Currently    Alcohol/week: 3.0 standard drinks    Types: 3 Glasses of wine per week  . Drug use: No    Home Medications Prior to Admission medications   Medication Sig Start Date End Date Taking? Authorizing Provider  amoxicillin-clavulanate (AUGMENTIN) 875-125 MG tablet Take 1 tablet by mouth every 12 (twelve) hours for 7 days. 05/18/20 05/25/20 Yes Breck Coons, MD  Prenatal Vit-Fe Fumarate-FA  (MULTIVITAMIN-PRENATAL) 27-0.8 MG TABS tablet Take 1 tablet by mouth daily at 12 noon.    [provider]    Allergies    Patient has no known allergies.  Review of Systems   Review of Systems  Constitutional: Negative for chills and fever.  HENT: Positive for congestion, facial swelling, rhinorrhea, sinus pressure and sinus pain.   Respiratory: Negative for cough and shortness of breath.   Cardiovascular: Negative for chest pain and palpitations.  Gastrointestinal: Negative for diarrhea, nausea and vomiting.  Genitourinary: Negative for difficulty urinating and dysuria.  Musculoskeletal: Negative for arthralgias and back pain.  Skin: Negative for rash and wound.  Neurological: Positive for headaches. Negative for light-headedness.    Physical Exam Updated Vital Signs BP 133/87   Pulse 93   Temp 99.5 F (37.5 C) (Oral)   Resp 16   Ht 5\' 6"  (1.676 m)   Wt 100.2 kg   LMP 06/06/2019   SpO2 99%   BMI 35.67 kg/m   Physical Exam Vitals and  nursing note reviewed. Exam conducted with a chaperone present.  Constitutional:      General: She is not in acute distress.    Appearance: Normal appearance.  HENT:     Head: Normocephalic and atraumatic.     Right Ear: No swelling or tenderness. A middle ear effusion is present. There is no impacted cerumen. No mastoid tenderness.     Left Ear: No swelling or tenderness. A middle ear effusion is present. There is no impacted cerumen. No mastoid tenderness.     Nose: Congestion and rhinorrhea present.     Right Turbinates: Enlarged.     Left Turbinates: Enlarged.     Right Sinus: Maxillary sinus tenderness and frontal sinus tenderness present.     Left Sinus: Maxillary sinus tenderness and frontal sinus tenderness present.  Eyes:     General:        Right eye: No discharge.        Left eye: No discharge.     Conjunctiva/sclera: Conjunctivae normal.  Cardiovascular:     Rate and Rhythm: Normal rate and regular rhythm.   Pulmonary:     Effort: Pulmonary effort is normal. No respiratory distress.     Breath sounds: No stridor.  Abdominal:     General: Abdomen is flat. There is no distension.     Palpations: Abdomen is soft.  Musculoskeletal:        General: No tenderness or signs of injury.  Skin:    General: Skin is warm and dry.  Neurological:     General: No focal deficit present.     Mental Status: She is alert. Mental status is at baseline.     Motor: No weakness.     Comments: 5 out of 5 motor strength in all extremities, sensation intact throughout, no dysmetria, no dysdiadochokinesia, no ataxia with ambulation, cranial nerves II through XII intact, alert and oriented to person place and time   Psychiatric:        Mood and Affect: Mood normal.        Behavior: Behavior normal.     ED Results / Procedures / Treatments   Labs (all labs ordered are listed, but only abnormal results are displayed) Labs Reviewed - No data to display  EKG None  Radiology No results found.  Procedures Procedures   Medications Ordered in ED Medications  ketorolac (TORADOL) 15 MG/ML injection 15 mg (has no administration in time range)  acetaminophen (TYLENOL) tablet 1,000 mg (has no administration in time range)    ED Course  I have reviewed the triage vital signs and the nursing notes.  Pertinent labs & imaging results that were available during my care of the patient were reviewed by me and considered in my medical decision making (see chart for details).    MDM Rules/Calculators/A&P                          Patient's presentation consistent with sinusitis.  Given timeframe likely viral.  Has no facial asymmetry is no significant swelling.  Has normal cranial nerves and otherwise normal neurologic exam.  Patient will be sent home with symptomatic treatment for sinusitis told to follow-up with primary care in few days for reassessment given return precautions.  She is given a printed out  prescription for Augmentin and told that if symptoms persist she can fill this at day 10.  However she is instructed to seek evaluation or reassessment if symptoms worsen. Final  Clinical Impression(s) / ED Diagnoses Final diagnoses:  Acute non-recurrent sinusitis, unspecified location    Rx / DC Orders ED Discharge Orders         Ordered    amoxicillin-clavulanate (AUGMENTIN) 875-125 MG tablet  Every 12 hours        05/18/20 1150           Breck Coons, MD 05/18/20 1154

## 2020-05-18 NOTE — Discharge Instructions (Addendum)
You can take 600 mg of ibuprofen every 6 hours, you can take 1000 mg of Tylenol every 6 hours, you can alternate these every 3 or you can take them together.  

## 2020-05-22 ENCOUNTER — Encounter (HOSPITAL_COMMUNITY): Payer: Self-pay | Admitting: Emergency Medicine

## 2020-05-22 ENCOUNTER — Emergency Department (HOSPITAL_COMMUNITY)
Admission: EM | Admit: 2020-05-22 | Discharge: 2020-05-23 | Disposition: A | Discharge status: Home / Self Care | Payer: BC Managed Care – PPO | Attending: Emergency Medicine | Admitting: Emergency Medicine

## 2020-05-22 ENCOUNTER — Other Ambulatory Visit: Payer: Self-pay

## 2020-05-22 DIAGNOSIS — Z20822 Contact with and (suspected) exposure to covid-19: Secondary | ICD-10-CM | POA: Diagnosis not present

## 2020-05-22 DIAGNOSIS — M79604 Pain in right leg: Secondary | ICD-10-CM | POA: Diagnosis not present

## 2020-05-22 DIAGNOSIS — M79605 Pain in left leg: Secondary | ICD-10-CM | POA: Insufficient documentation

## 2020-05-22 DIAGNOSIS — M791 Myalgia, unspecified site: Secondary | ICD-10-CM

## 2020-05-22 NOTE — ED Triage Notes (Signed)
Pt reports bilateral lower leg swelling since starting a new medications for a sinus infection. VSS. NAD at present.

## 2020-05-22 NOTE — ED Notes (Signed)
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  Pt states that the pain has been increasing and at this point it is "crampy and locked up".  When looking at her legs they appear warm and red.  Pt states that this is due to wearing UGGs today (they have been off since 5pm) and the redness has increased on her lower legs.  Slight warmth to touch.  No sob.

## 2020-05-23 ENCOUNTER — Emergency Department (HOSPITAL_BASED_OUTPATIENT_CLINIC_OR_DEPARTMENT_OTHER): Payer: BC Managed Care – PPO

## 2020-05-23 DIAGNOSIS — R609 Edema, unspecified: Secondary | ICD-10-CM | POA: Diagnosis not present

## 2020-05-23 LAB — D-DIMER, QUANTITATIVE: D-Dimer, Quant: 2.57 ug/mL-FEU — ABNORMAL HIGH (ref 0.00–0.50)

## 2020-05-23 LAB — COMPREHENSIVE METABOLIC PANEL
ALT: 14 U/L (ref 0–44)
AST: 21 U/L (ref 15–41)
Albumin: 3.5 g/dL (ref 3.5–5.0)
Alkaline Phosphatase: 67 U/L (ref 38–126)
Anion gap: 12 (ref 5–15)
BUN: 9 mg/dL (ref 6–20)
CO2: 24 mmol/L (ref 22–32)
Calcium: 9.3 mg/dL (ref 8.9–10.3)
Chloride: 98 mmol/L (ref 98–111)
Creatinine, Ser: 0.82 mg/dL (ref 0.44–1.00)
GFR, Estimated: >60 mL/min (ref >60)
Glucose, Bld: 106 mg/dL — ABNORMAL HIGH (ref 70–99)
Potassium: 3.7 mmol/L (ref 3.5–5.1)
Sodium: 134 mmol/L — ABNORMAL LOW (ref 135–145)
Total Bilirubin: 0.6 mg/dL (ref 0.3–1.2)
Total Protein: 8.2 g/dL — ABNORMAL HIGH (ref 6.5–8.1)

## 2020-05-23 LAB — URINALYSIS, ROUTINE W REFLEX MICROSCOPIC
Bacteria, UA: NONE SEEN
Bilirubin Urine: NEGATIVE
Glucose, UA: NEGATIVE mg/dL
Ketones, ur: 5 mg/dL — AB
Nitrite: NEGATIVE
Protein, ur: NEGATIVE mg/dL
Specific Gravity, Urine: 1.006 (ref 1.005–1.030)
pH: 6 (ref 5.0–8.0)

## 2020-05-23 LAB — CBC
HCT: 36.5 % (ref 36.0–46.0)
Hemoglobin: 11.4 g/dL — ABNORMAL LOW (ref 12.0–15.0)
MCH: 22.7 pg — ABNORMAL LOW (ref 26.0–34.0)
MCHC: 31.2 g/dL (ref 30.0–36.0)
MCV: 72.6 fL — ABNORMAL LOW (ref 80.0–100.0)
Platelets: 465 10*3/uL — ABNORMAL HIGH (ref 150–400)
RBC: 5.03 MIL/uL (ref 3.87–5.11)
RDW: 16.6 % — ABNORMAL HIGH (ref 11.5–15.5)
WBC: 9 10*3/uL (ref 4.0–10.5)
nRBC: 0 % (ref 0.0–0.2)

## 2020-05-23 LAB — CK: Total CK: 165 U/L (ref 38–234)

## 2020-05-23 LAB — I-STAT BETA HCG BLOOD, ED (MC, WL, AP ONLY): I-stat hCG, quantitative: <5 m[IU]/mL (ref <5)

## 2020-05-23 LAB — SARS CORONAVIRUS 2 (TAT 6-24 HRS): SARS Coronavirus 2: NEGATIVE

## 2020-05-23 MED ORDER — NAPROXEN 500 MG PO TABS
500.0000 mg | ORAL_TABLET | Freq: Two times a day (BID) | ORAL | 0 refills | Status: AC
Start: 2020-05-23 — End: ?

## 2020-05-23 MED ORDER — IBUPROFEN 800 MG PO TABS
800.0000 mg | ORAL_TABLET | Freq: Once | ORAL | Status: AC
  Administered 2020-05-23: 800 mg via ORAL
  Filled 2020-05-23: qty 1

## 2020-05-23 MED ORDER — ACETAMINOPHEN 500 MG PO TABS
1000.0000 mg | ORAL_TABLET | Freq: Once | ORAL | Status: AC
  Administered 2020-05-23: 1000 mg via ORAL
  Filled 2020-05-23: qty 2

## 2020-05-23 NOTE — ED Provider Notes (Signed)
Care of the patient assumed at the change of shift pending LE Doppler. Has had recent URI symptoms and now myalgias, leg pain.  Physical Exam  BP 109/78   Pulse 83   Temp 98.8 F (37.1 C) (Oral)   Resp 18   Ht 5\' 6"  (1.676 m)   Wt 100.2 kg   LMP 06/06/2019   SpO2 99%   BMI 35.67 kg/m   Physical Exam  ED Course/Procedures     Procedures  MDM  9:24 AM Doppler is neg for DVT. Covid is neg. Patient is resting comfortably, no distress. Advised that viral illness may not respond to Abx but recommend she complete the course since she has started it. APAP, Motrin for aches and pain, recommend she drink plenty of fluid and rest.      Truddie Hidden, MD 05/23/20 [phone]

## 2020-05-23 NOTE — ED Provider Notes (Signed)
[facility] Emergency Department Provider Note MRN:  [id number]  Arrival date & time: 05/23/20     Chief Complaint   Leg Swelling   History of Present Illness   Patricia Wilkinson is a 30 y.o. year-old female with no pertinent past medical presenting to the ED with chief complaint of leg swelling.  Recently diagnosed with pinkeye bilaterally, has been taking some eyedrops.  Was then diagnosed with sinus infection, taking antibiotics.  Now for the past few days experiencing bilateral leg pain/soreness, feels swollen.  Pain mostly in bilateral calves.  Some redness noted to the ankles bilaterally.  Denies fever or cough, no chest pain or shortness of breath, no abdominal pain.  No recent diarrheal illness or STDs.  Review of Systems  A complete 10 system review of systems was obtained and all systems are negative except as noted in the HPI and PMH.   Patient's Health History    Past Medical History:  Diagnosis Date  . Anemia   . Fibroid   . Migraine headache     Past Surgical History:  Procedure Laterality Date  . NO PAST SURGERIES      Family History  Problem Relation Age of Onset  . Migraines Mother   . Hypertension Mother   . Fibromyalgia Mother   . Hypertension Father   . Diabetes Other   . Cancer Other   . Stroke Other     Social History   Socioeconomic History  . Marital status: Single    Spouse name: Not on file  . Number of children: Not on file  . Years of education: Not on file  . Highest education level: Not on file  Occupational History  . Not on file  Tobacco Use  . Smoking status: Never Smoker  . Smokeless tobacco: Never Used  Vaping Use  . Vaping Use: Some days  . Devices: CBD  Substance and Sexual Activity  . Alcohol use: Not Currently    Alcohol/week: 3.0 standard drinks    Types: 3 Glasses of wine per week  . Drug use: No  . Sexual activity: Yes    Birth control/protection: None  Other Topics Concern  . Not on file   Social History Narrative  . Not on file   Social Determinants of Health   Financial Resource Strain: Not on file  Food Insecurity: Not on file  Transportation Needs: Not on file  Physical Activity: Not on file  Stress: Not on file  Social Connections: Not on file  Intimate Partner Violence: Not on file     Physical Exam   Vitals:   05/23/20 0600 05/23/20 0615  BP: 119/78 112/78  Pulse: 91 84  Resp:    Temp:    SpO2: 98% 98%    CONSTITUTIONAL: Well-appearing, NAD NEURO:  Alert and oriented x 3, no focal deficits EYES:  eyes equal and reactive ENT/NECK:  no LAD, no JVD CARDIO: Regular rate, well-perfused, normal S1 and S2 PULM:  CTAB no wheezing or rhonchi GI/GU:  normal bowel sounds, non-distended, non-tender MSK/SPINE:  No gross deformities, no edema, tenderness palpation to the bilateral calves, preserved range of motion of ankles and knees SKIN: Macular erythematous rash to bilateral ankles PSYCH:  Appropriate speech and behavior  *Additional and/or pertinent findings included in MDM below  Diagnostic and Interventional Summary    EKG Interpretation  Date/Time:    Ventricular Rate:    PR Interval:    QRS Duration:   QT Interval:  QTC Calculation:   R Axis:     Text Interpretation:        Labs Reviewed  CBC - Abnormal; Notable for the following components:      Result Value   Hemoglobin 11.4 (*)    MCV 72.6 (*)    MCH 22.7 (*)    RDW 16.6 (*)    Platelets 465 (*)    All other components within normal limits  COMPREHENSIVE METABOLIC PANEL - Abnormal; Notable for the following components:   Sodium 134 (*)    Glucose, Bld 106 (*)    Total Protein 8.2 (*)    All other components within normal limits  D-DIMER, QUANTITATIVE - Abnormal; Notable for the following components:   D-Dimer, Quant 2.57 (*)    All other components within normal limits  URINALYSIS, ROUTINE W REFLEX MICROSCOPIC - Abnormal; Notable for the following components:   Color, Urine  STRAW (*)    Hgb urine dipstick SMALL (*)    Ketones, ur 5 (*)    Leukocytes,Ua TRACE (*)    All other components within normal limits  SARS CORONAVIRUS 2 (TAT 6-24 HRS)  CK  I-STAT BETA HCG BLOOD, ED ([facility], [facility], [facility] ONLY)  GC/CHLAMYDIA PROBE AMP ([place]) NOT AT [facility]    VAS [place] LOWER EXTREMITY VENOUS (DVT) (ONLY [facility] & [facility])    (Results Pending)    Medications  acetaminophen (TYLENOL) tablet 1,000 mg (1,000 mg Oral Given 05/23/20 0111)     Procedures  /  Critical Care Procedures  ED Course and Medical Decision Making  I have reviewed the triage vital signs, the nursing notes, and pertinent available records from the EMR.  Listed above are laboratory and imaging tests that I personally ordered, reviewed, and interpreted and then considered in my medical decision making (see below for details).  Considering viral illness, also considering a reactive arthritis given the conjunctivitis and possible joint related pain in the lower extremities.  On exam the pain seems more localized to the bilateral calves, and so considering rhabdo, less likely PE.  Awaiting screening labs.     Labs reveal elevated D-dimer, elevated platelets, otherwise normal.  I favor that these are elevated as acute phase reactants, possibly due to viral process or less likely a reactive arthritis.  Patient has no chest pain or shortness of breath, nothing to suggest PE.  Will obtain DVT ultrasounds to exclude.  If negative, patient would be appropriate for discharge with anti-inflammatories and close follow-up.  Signed out to oncoming provider at shift change.  Barth Kirks. Sedonia Small, [place] [email or web address]  Final Clinical Impressions(s) / ED Diagnoses     ICD-10-CM   1. Pain in both lower extremities  M79.604    M79.605     ED Discharge Orders         Ordered    naproxen (NAPROSYN) 500 MG tablet  2 times daily        05/23/20 0631           Discharge  Instructions Discussed with and Provided to Patient:     Discharge Instructions     You were evaluated in the Emergency Department and after careful evaluation, we did not find any emergent condition requiring admission or further testing in the hospital.  Your exam/testing today was overall reassuring.  Symptoms may be due to a viral or inflammatory process.  Recommend using the Naprosyn prescription for pain and following up on your Covid  test at home.  Please return to the Emergency Department if you experience any worsening of your condition.  Thank you for allowing [place] to be a part of your care.        Maudie Flakes, MD 05/23/20 [phone]

## 2020-05-23 NOTE — Discharge Instructions (Addendum)
You were evaluated in the Emergency Department and after careful evaluation, we did not find any emergent condition requiring admission or further testing in the hospital.  Your exam/testing today was overall reassuring.  Symptoms may be due to a viral or inflammatory process.  Recommend using the Naprosyn prescription for pain and following up on your Covid test at home.  Please return to the Emergency Department if you experience any worsening of your condition.  Thank you for allowing [place] to be a part of your care.

## 2020-05-23 NOTE — Progress Notes (Signed)
Lower extremity venous has been completed.   Preliminary results in CV Proc.   Abram Sander 05/23/2020 9:00 AM

## 2020-05-23 NOTE — ED Notes (Signed)
Pt given graham crackers, peanut butter, and water 

## 2020-05-23 NOTE — ED Notes (Signed)
EDP at bedside to speak with pt about her leg pain before leaving.

## 2020-05-24 ENCOUNTER — Encounter (HOSPITAL_COMMUNITY): Payer: Self-pay

## 2020-05-24 ENCOUNTER — Emergency Department (HOSPITAL_COMMUNITY)
Admission: EM | Admit: 2020-05-24 | Discharge: 2020-05-24 | Disposition: A | Discharge status: Home / Self Care | Payer: BC Managed Care – PPO | Attending: Emergency Medicine | Admitting: Emergency Medicine

## 2020-05-24 ENCOUNTER — Other Ambulatory Visit: Payer: Self-pay

## 2020-05-24 DIAGNOSIS — L509 Urticaria, unspecified: Secondary | ICD-10-CM | POA: Diagnosis not present

## 2020-05-24 DIAGNOSIS — L299 Pruritus, unspecified: Secondary | ICD-10-CM | POA: Diagnosis present

## 2020-05-24 DIAGNOSIS — T7840XA Allergy, unspecified, initial encounter: Secondary | ICD-10-CM

## 2020-05-24 MED ORDER — DIPHENHYDRAMINE HCL 25 MG PO CAPS
25.0000 mg | ORAL_CAPSULE | Freq: Once | ORAL | Status: AC
  Administered 2020-05-24: 25 mg via ORAL
  Filled 2020-05-24: qty 1

## 2020-05-24 MED ORDER — FAMOTIDINE 20 MG PO TABS: 20.0000 mg | ORAL_TABLET | Freq: Two times a day (BID) | ORAL | 0 refills | Status: AC

## 2020-05-24 MED ORDER — PREDNISONE 10 MG (21) PO TBPK
ORAL_TABLET | Freq: Every day | ORAL | 0 refills | Status: DC
Start: 2020-05-24 — End: 2022-11-25

## 2020-05-24 MED ORDER — FAMOTIDINE 20 MG PO TABS
40.0000 mg | ORAL_TABLET | Freq: Once | ORAL | Status: AC
  Administered 2020-05-24: 40 mg via ORAL
  Filled 2020-05-24: qty 2

## 2020-05-24 MED ORDER — PREDNISONE 20 MG PO TABS
60.0000 mg | ORAL_TABLET | Freq: Once | ORAL | Status: AC
  Administered 2020-05-24: 60 mg via ORAL
  Filled 2020-05-24: qty 3

## 2020-05-24 NOTE — ED Provider Notes (Signed)
[facility] DEPT Provider Note   CSN: [id number] Arrival date & time: 05/24/20  2224     History No chief complaint on file.   Patricia Wilkinson is a 30 y.o. female.  30 year old female presents with swelling and pruritus to her feet.  Seen here recently for swelling to her left lower extremity had a negative Doppler scan.  Has been taking Augmentin for sinus infection.  Denies any intraoral discomfort.  No other rashes noted on her body.  Has not been short of breath.        Past Medical History:  Diagnosis Date  . Anemia   . Fibroid   . Migraine headache     Patient Active Problem List   Diagnosis Date Noted  . Migraine headache     Past Surgical History:  Procedure Laterality Date  . NO PAST SURGERIES       OB History    Gravida  1   Para      Term      Preterm      AB      Living        SAB      IAB      Ectopic      Multiple      Live Births              Family History  Problem Relation Age of Onset  . Migraines Mother   . Hypertension Mother   . Fibromyalgia Mother   . Hypertension Father   . Diabetes Other   . Cancer Other   . Stroke Other     Social History   Tobacco Use  . Smoking status: Never Smoker  . Smokeless tobacco: Never Used  Vaping Use  . Vaping Use: Some days  . Devices: CBD  Substance Use Topics  . Alcohol use: Not Currently    Alcohol/week: 3.0 standard drinks    Types: 3 Glasses of wine per week  . Drug use: No    Home Medications Prior to Admission medications   Medication Sig Start Date End Date Taking? Authorizing Provider  amoxicillin-clavulanate (AUGMENTIN) 875-125 MG tablet Take 1 tablet by mouth every 12 (twelve) hours for 7 days. 05/18/20 05/25/20  Patricia Coons, MD  naproxen (NAPROSYN) 500 MG tablet Take 1 tablet (500 mg total) by mouth 2 (two) times daily. 05/23/20   Maudie Flakes, MD  Prenatal Vit-Fe Fumarate-FA (MULTIVITAMIN-PRENATAL) 27-0.8 MG TABS tablet Take  1 tablet by mouth daily at 12 noon.    [provider]    Allergies    Patient has no known allergies.  Review of Systems   Review of Systems  All other systems reviewed and are negative.   Physical Exam Updated Vital Signs BP 137/87 (BP Location: Right Arm)   Pulse 97   Temp 99 F (37.2 C) (Oral)   Resp 18   Ht 1.676 m (5\' 6" )   Wt 100.2 kg   LMP 06/06/2019   SpO2 100%   BMI 35.67 kg/m   Physical Exam Vitals and nursing note reviewed.  Constitutional:      General: She is not in acute distress.    Appearance: Normal appearance. She is well-developed and well-nourished. She is not toxic-appearing.  HENT:     Head: Normocephalic and atraumatic.     Comments: No intraoral lesions appreciated. Eyes:     General: Lids are normal.     Extraocular Movements: EOM normal.  Conjunctiva/sclera: Conjunctivae normal.     Pupils: Pupils are equal, round, and reactive to light.  Neck:     Thyroid: No thyroid mass.     Trachea: No tracheal deviation.  Cardiovascular:     Rate and Rhythm: Normal rate and regular rhythm.     Heart sounds: Normal heart sounds. No murmur heard. No gallop.   Pulmonary:     Effort: Pulmonary effort is normal. No respiratory distress.     Breath sounds: Normal breath sounds. No stridor. No decreased breath sounds, wheezing, rhonchi or rales.  Abdominal:     General: Bowel sounds are normal. There is no distension.     Palpations: Abdomen is soft.     Tenderness: There is no abdominal tenderness. There is no CVA tenderness or rebound.  Musculoskeletal:        General: No tenderness or edema. Normal range of motion.     Cervical back: Normal range of motion and neck supple.     Comments: 2+ bilateral lower extremity edema with some erythema noted extending midway up the anterior tibia on both sides.  Hives noted on dorsal surface of left foot.  Skin:    General: Skin is warm and dry.     Findings: Rash present. No abrasion. Rash is  urticarial.  Neurological:     Mental Status: She is alert and oriented to person, place, and time.     GCS: GCS eye subscore is 4. GCS verbal subscore is 5. GCS motor subscore is 6.     Cranial Nerves: No cranial nerve deficit.     Sensory: No sensory deficit.     Deep Tendon Reflexes: Strength normal.  Psychiatric:        Mood and Affect: Mood and affect normal.        Speech: Speech normal.        Behavior: Behavior normal.     ED Results / Procedures / Treatments   Labs (all labs ordered are listed, but only abnormal results are displayed) Labs Reviewed - No data to display  EKG None  Radiology VAS [place] LOWER EXTREMITY VENOUS (DVT) (ONLY [facility] & [facility])  Result Date: 05/23/2020  Lower Venous DVT Study Indications: Edema.  Comparison Study: no prior Performing Technologist: Abram Sander RVS  Examination Guidelines: A complete evaluation includes B-mode imaging, spectral Doppler, color Doppler, and power Doppler as needed of all accessible portions of each vessel. Bilateral testing is considered an integral part of a complete examination. Limited examinations for reoccurring indications may be performed as noted. The reflux portion of the exam is performed with the patient in reverse Trendelenburg.  +---------+---------------+---------+-----------+----------+--------------+ RIGHT    CompressibilityPhasicitySpontaneityPropertiesThrombus Aging +---------+---------------+---------+-----------+----------+--------------+ CFV      Full           Yes      Yes                                 +---------+---------------+---------+-----------+----------+--------------+ SFJ      Full                                                        +---------+---------------+---------+-----------+----------+--------------+ FV Prox  Full                                                        +---------+---------------+---------+-----------+----------+--------------+  FV Mid   Full                                                         +---------+---------------+---------+-----------+----------+--------------+ FV DistalFull                                                        +---------+---------------+---------+-----------+----------+--------------+ PFV      Full                                                        +---------+---------------+---------+-----------+----------+--------------+ POP      Full           Yes      Yes                                 +---------+---------------+---------+-----------+----------+--------------+ PTV      Full                                                        +---------+---------------+---------+-----------+----------+--------------+ PERO     Full                                                        +---------+---------------+---------+-----------+----------+--------------+   +---------+---------------+---------+-----------+----------+-------------------+ LEFT     CompressibilityPhasicitySpontaneityPropertiesThrombus Aging      +---------+---------------+---------+-----------+----------+-------------------+ CFV      Full           Yes      Yes                                      +---------+---------------+---------+-----------+----------+-------------------+ SFJ      Full                                                             +---------+---------------+---------+-----------+----------+-------------------+ FV Prox  Full                                                             +---------+---------------+---------+-----------+----------+-------------------+ FV Mid   Full                                                             +---------+---------------+---------+-----------+----------+-------------------+  FV DistalFull                                                             +---------+---------------+---------+-----------+----------+-------------------+ PFV       Full                                                             +---------+---------------+---------+-----------+----------+-------------------+ POP      Full           Yes      Yes                                      +---------+---------------+---------+-----------+----------+-------------------+ PTV      Full                                                             +---------+---------------+---------+-----------+----------+-------------------+ PERO                                                  Not well visualized +---------+---------------+---------+-----------+----------+-------------------+     Summary: BILATERAL: - No evidence of deep vein thrombosis seen in the lower extremities, bilaterally. - No evidence of superficial venous thrombosis in the lower extremities, bilaterally. -No evidence of popliteal cyst, bilaterally.   *See table(s) above for measurements and observations. Electronically signed by Harold Barban MD on 05/23/2020 at 9:25:47 PM.    Final     Procedures Procedures   Medications Ordered in ED Medications - No data to display  ED Course  I have reviewed the triage vital signs and the nursing notes.  Pertinent labs & imaging results that were available during my care of the patient were reviewed by me and considered in my medical decision making (see chart for details).    MDM Rules/Calculators/A&P                          Patient would like allergic reaction to Augmentin.  Have treated patient here with prednisone, Pepcid, or Benadryl.  She was instructed to stop taking her Augmentin.  Will start on prednisone taper return precautions given Final Clinical Impression(s) / ED Diagnoses Final diagnoses:  None    Rx / DC Orders ED Discharge Orders    None       Lacretia Leigh, MD 05/24/20 2343

## 2020-05-24 NOTE — ED Triage Notes (Signed)
Pt complains of left lower leg and ankle swelling, she has been tested for DVT and it was negative, she states that her ankle and foot are discolored and itching now

## 2020-05-24 NOTE — Discharge Instructions (Addendum)
Use Benadryl as directed for itching.  Return here if he has any trouble breathing, have sores in your mouth, or any other problems

## 2022-01-27 ENCOUNTER — Emergency Department (HOSPITAL_COMMUNITY)
Admission: EM | Admit: 2022-01-27 | Discharge: 2022-01-27 | Discharge status: Against Medical Advice | Payer: BC Managed Care – PPO | Attending: Emergency Medicine | Admitting: Emergency Medicine

## 2022-01-27 ENCOUNTER — Encounter (HOSPITAL_COMMUNITY): Payer: Self-pay

## 2022-01-27 ENCOUNTER — Other Ambulatory Visit: Payer: Self-pay

## 2022-01-27 ENCOUNTER — Emergency Department (HOSPITAL_COMMUNITY)
Admission: EM | Admit: 2022-01-27 | Discharge: 2022-01-28 | Disposition: A | Discharge status: Home / Self Care | Payer: BC Managed Care – PPO | Source: Home / Self Care | Attending: Emergency Medicine | Admitting: Emergency Medicine

## 2022-01-27 DIAGNOSIS — K644 Residual hemorrhoidal skin tags: Secondary | ICD-10-CM | POA: Insufficient documentation

## 2022-01-27 DIAGNOSIS — K649 Unspecified hemorrhoids: Secondary | ICD-10-CM | POA: Diagnosis not present

## 2022-01-27 DIAGNOSIS — Z5321 Procedure and treatment not carried out due to patient leaving prior to being seen by health care provider: Secondary | ICD-10-CM | POA: Diagnosis not present

## 2022-01-27 DIAGNOSIS — R102 Pelvic and perineal pain: Secondary | ICD-10-CM | POA: Diagnosis present

## 2022-01-27 NOTE — ED Provider Triage Note (Signed)
Emergency Medicine Provider Triage Evaluation Note  Patricia Wilkinson , a 31 y.o. female  was evaluated in triage.  Pt complains of pain with having bowel movements around her anusx1 day. Throbbing sensation. Difficulty sitting or standing w/o pain. No blood in stools. Reports straining with bowel movements. Has not taken any pain meds.   Review of Systems  Positive: Rectal pain Negative: Hematochezia, melena  Physical Exam  BP (!) 133/94 (BP Location: Right Arm)   Pulse 75   Temp 98.6 F (37 C) (Oral)   Resp 18   Ht '5\' 6"'$  (1.676 m)   Wt 101.6 kg   LMP 01/20/2022 (Exact Date)   SpO2 100%   Breastfeeding No   BMI 36.15 kg/m  Gen:   Awake, no distress   Resp:  Normal effort  MSK:   Moves extremities without difficulty   Medical Decision Making  Medically screening exam initiated at 6:52 PM.  Appropriate orders placed.  [name] was informed that the remainder of the evaluation will be completed by another provider, this initial triage assessment does not replace that evaluation, and the importance of remaining in the ED until their evaluation is complete.   Osvaldo Shipper, [place] 01/27/22 1854

## 2022-01-27 NOTE — ED Triage Notes (Signed)
Pt reports she has hemorrhoids and has been having pain when sitting. Denies bleeding.

## 2022-01-27 NOTE — ED Provider Triage Note (Signed)
Emergency Medicine Provider Triage Evaluation Note  Patricia Wilkinson , a 31 y.o. female  was evaluated in triage.  Pt complains of hemorrhoids onset 1 day.  Notes it is a throbbing sensation.  Notes that she has difficulty sitting or standing without pain.  No blood in stools.  Notes that she has been straining with her bowel movements.  Notes that she has a history of similar symptoms.  Notes that the area feels like to hemorrhoids. Denies any abdominal pain.  Has tried Preparation H once today without relief of her symptoms.  No additional medications tried prior to arrival.  Review of Systems  Positive:  Negative:   Physical Exam  BP (!) 157/100 (BP Location: Right Arm)   Pulse (!) 57   Temp 98.2 F (36.8 C) (Oral)   Resp 16   LMP 01/20/2022 (Exact Date)   SpO2 99%  Gen:   Awake, no distress   Resp:  Normal effort  MSK:   Moves extremities without difficulty  Other:  No abdominal tenderness to palpation  Medical Decision Making  Medically screening exam initiated at 11:51 PM.  Appropriate orders placed.  Patricia Wilkinson was informed that the remainder of the evaluation will be completed by another provider, this initial triage assessment does not replace that evaluation, and the importance of remaining in the ED until their evaluation is complete.  Work-up initiated   Patricia Wilkinson A, PA-C 01/27/22 2353

## 2022-01-27 NOTE — ED Triage Notes (Signed)
Patient arrived stating she thinks she has hemorrhoids, pain while standing and sitting over the last three days. Hx of same.

## 2022-01-27 NOTE — ED Notes (Signed)
Patient states she is leaving.

## 2022-01-28 MED ORDER — HYDROCORTISONE ACETATE 25 MG RE SUPP: 25.0000 mg | Freq: Two times a day (BID) | RECTAL | 0 refills | Status: AC

## 2022-01-28 NOTE — ED Notes (Signed)
Pt verbalized understanding of discharge instructions. Pt dressed for discharge. Pt ambulated from ED with steady gait. Pt has access to home.

## 2022-01-28 NOTE — Discharge Instructions (Addendum)
On your exam you did have couple external.  Discussed using an U-Cort which I have prescribed for you, taken sitz bath.  Also gave you referral to gastroenterologist if this does not improve.  For any concerning symptoms return to the emergency room.

## 2022-01-28 NOTE — ED Provider Notes (Signed)
[facility] DEPT Provider Note   CSN: [id number] Arrival date & time: 01/27/22  2257     History  Chief Complaint  Patient presents with   Hemorrhoids    Patricia Wilkinson is a 31 y.o. female.  31 year old female presents today for evaluation of possible hemorrhoids.  She states she has been having rectal pain.  Denies abdominal pain, nausea, vomiting, hematemesis, melanotic stools.  States she had bright red blood per rectum once upon wiping otherwise no blood in her stools.  She does report history of hemorrhoids in the past.  Endorses constipation and straining.  Has tried Preparation H, but without significant relief.  The history is provided by the patient. No language interpreter was used.       Home Medications Prior to Admission medications   Medication Sig Start Date End Date Taking? Authorizing Provider  phentermine 37.5 MG capsule Take 37.5 mg by mouth daily. 01/16/22  Yes [provider]  famotidine (PEPCID) 20 MG tablet Take 1 tablet (20 mg total) by mouth 2 (two) times daily. 05/24/20   Lacretia Leigh, MD  naproxen (NAPROSYN) 500 MG tablet Take 1 tablet (500 mg total) by mouth 2 (two) times daily. 05/23/20   Maudie Flakes, MD  predniSONE (STERAPRED UNI-PAK 21 TAB) 10 MG (21) TBPK tablet Take by mouth daily. Take 6 tabs by mouth daily  for 2 days, then 5 tabs for 2 days, then 4 tabs for 2 days, then 3 tabs for 2 days, 2 tabs for 2 days, then 1 tab by mouth daily for 2 days 05/24/20   Lacretia Leigh, MD  Prenatal Vit-Fe Fumarate-FA (MULTIVITAMIN-PRENATAL) 27-0.8 MG TABS tablet Take 1 tablet by mouth daily at 12 noon.    [provider]      Allergies    Patient has no known allergies.    Review of Systems   Review of Systems  Constitutional:  Negative for fever.  Gastrointestinal:  Positive for constipation and rectal pain. Negative for abdominal pain, nausea and vomiting.  All other systems reviewed and are  negative.   Physical Exam Updated Vital Signs BP (!) 157/100 (BP Location: Right Arm)   Pulse (!) 57   Temp 98.2 F (36.8 C) (Oral)   Resp 16   LMP 01/20/2022 (Exact Date)   SpO2 99%  Physical Exam Vitals and nursing note reviewed. Exam conducted with a chaperone present.  Constitutional:      General: She is not in acute distress.    Appearance: Normal appearance. She is not ill-appearing.  HENT:     Head: Normocephalic and atraumatic.     Nose: Nose normal.  Eyes:     General: No scleral icterus.    Extraocular Movements: Extraocular movements intact.     Conjunctiva/sclera: Conjunctivae normal.  Cardiovascular:     Rate and Rhythm: Normal rate and regular rhythm.     Pulses: Normal pulses.  Pulmonary:     Effort: Pulmonary effort is normal. No respiratory distress.     Breath sounds: Normal breath sounds. No wheezing or rales.  Abdominal:     General: There is no distension.     Tenderness: There is no abdominal tenderness. There is no guarding.  Genitourinary:    Rectum: External hemorrhoid present.     Comments: Internal rectal exam deferred.  Multiple external hemorrhoids noted.  No evidence of thrombosed hemorrhoids. Musculoskeletal:        General: Normal range of motion.     Cervical  back: Normal range of motion.  Skin:    General: Skin is warm and dry.  Neurological:     General: No focal deficit present.     Mental Status: She is alert. Mental status is at baseline.     ED Results / Procedures / Treatments   Labs (all labs ordered are listed, but only abnormal results are displayed) Labs Reviewed - No data to display  EKG None  Radiology No results found.  Procedures Procedures    Medications Ordered in ED Medications - No data to display  ED Course/ Medical Decision Making/ A&P                           Medical Decision Making Risk Prescription drug management.   Medical Decision Making / ED Course   This patient presents to the  ED for concern of hemorrhoids, this involves an extensive number of treatment options, and is a complaint that carries with it a high risk of complications and morbidity.  The differential diagnosis includes external hemorrhoids, internal hemorrhoids, anal fissure  MDM: 31 year old female presents for concern of hemorrhoids.  Symptoms ongoing for 3 days.  Has tried Preparation H without significant relief.  Denies any melanotic stools, hematemesis, abdominal pain.  Had minimal blood upon wiping otherwise no rectal bleeding.  Vital signs are stable and without concern for acute GI bleed.  Exam shows evidence of multiple external hemorrhoids with tenderness to palpation.  Will provide Anticort, instructions on sitz bath, gastroenterology referral in case without symptom improvement.  Patient voices understanding and is in agreement with plan.  We did have an extensive discussion regarding aggressive bowel regimen including MiraLAX combined with Senokot.  Discussed good hydration.   Lab Tests: -I ordered, reviewed, and interpreted labs.   The pertinent results include:   Labs Reviewed - No data to display    EKG  EKG Interpretation  Date/Time:    Ventricular Rate:    PR Interval:    QRS Duration:   QT Interval:    QTC Calculation:   R Axis:     Text Interpretation:           Medicines ordered and prescription drug management: Meds ordered this encounter  Medications   hydrocortisone (ANUCORT-HC) 25 MG suppository    Sig: Place 1 suppository (25 mg total) rectally 2 (two) times daily.    Dispense:  12 suppository    Refill:  0    Order Specific Question:   Supervising Provider    Answer:   MILLER, [place]    -I have reviewed the patients home medicines and have made adjustments as needed  Reevaluation: After the interventions noted above, I reevaluated the patient and found that they have :stayed the same  Co morbidities that complicate the patient evaluation  Past  Medical History:  Diagnosis Date   Anemia    Fibroid    Migraine headache       Dispostion: Patient discharged in stable condition.  Return precautions discussed.  Patient voices understanding and is in agreement with plan.  Final Clinical Impression(s) / ED Diagnoses Final diagnoses:  External hemorrhoid    Rx / DC Orders ED Discharge Orders          Ordered    hydrocortisone (ANUCORT-HC) 25 MG suppository  2 times daily        01/28/22 0248  Evlyn Courier, PA-C 01/28/22 0316    Maudie Flakes, MD 01/28/22 [phone]

## 2022-03-13 IMAGING — US US OB < 14 WEEKS - US OB TV
1 series · 15 of 28 positions shown · non-contrast
Comparison: None

CLINICAL DATA: Vaginal bleeding in first trimester of pregnancy,
spotting yesterday; LMP 06/06/2019; no quantitative beta HCG for
correlation

EXAM:
OBSTETRIC <14 WK US AND TRANSVAGINAL OB US
TECHNIQUE: Both transabdominal and transvaginal ultrasound examinations were
performed for complete evaluation of the gestation as well as the
maternal uterus, adnexal regions, and pelvic cul-de-sac.
Transvaginal technique was performed to assess early pregnancy.

[Series 1: us ob < 14 weeks - us ob tv · 15 of 46 slices shown]
[im 1/46]
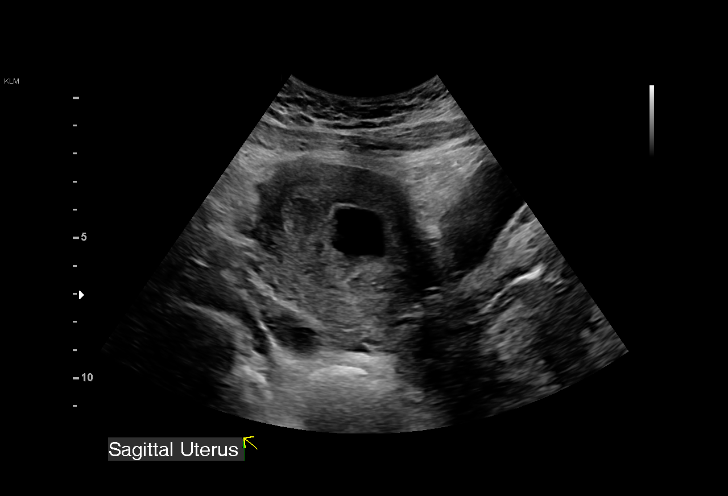
[im 4/46]
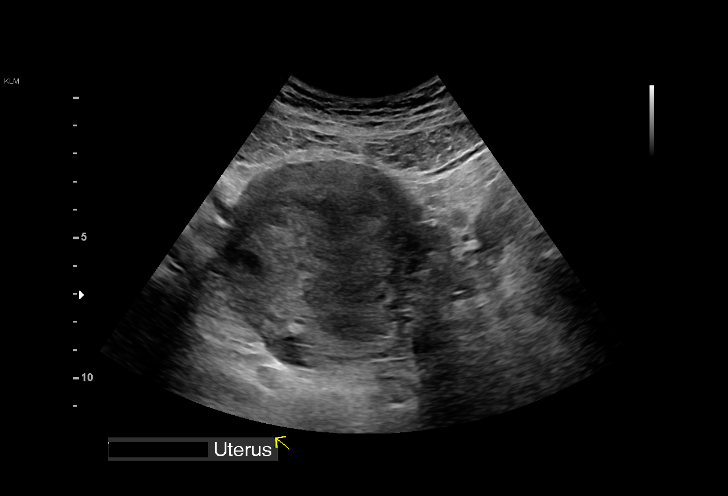
[im 7/46]
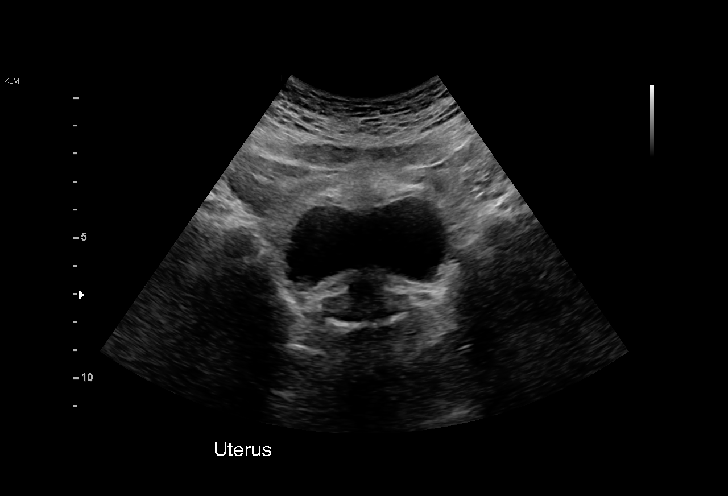
[im 11/46]
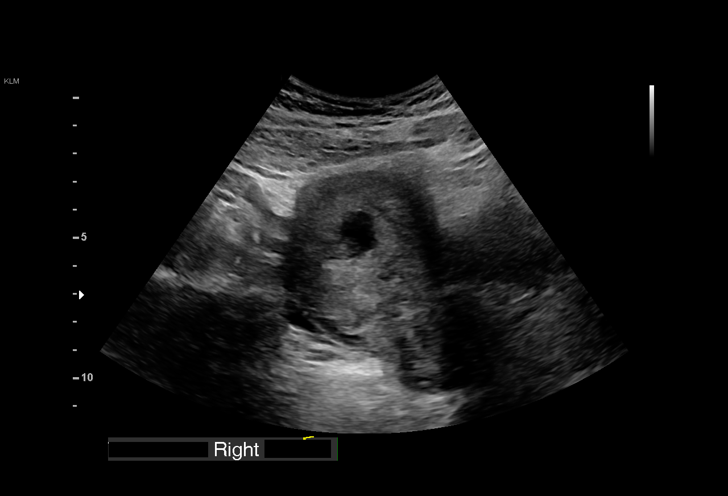
[im 14/46]
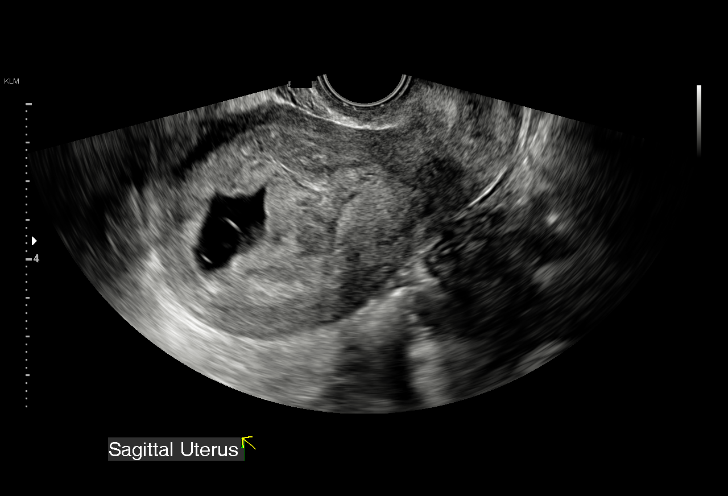
[im 17/46]
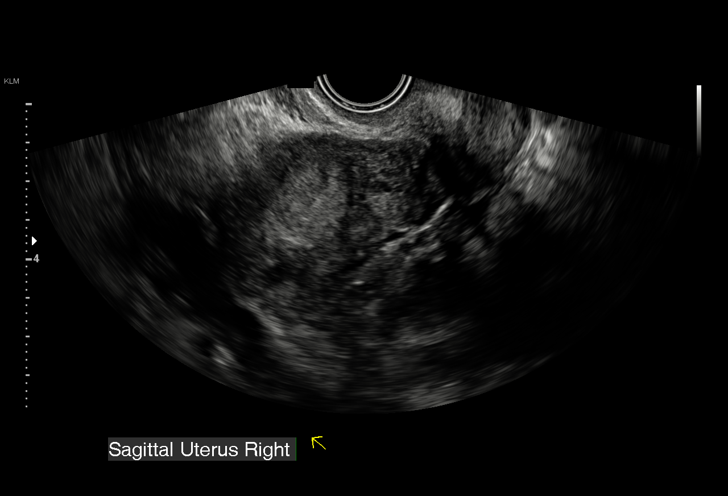
[im 21/46]
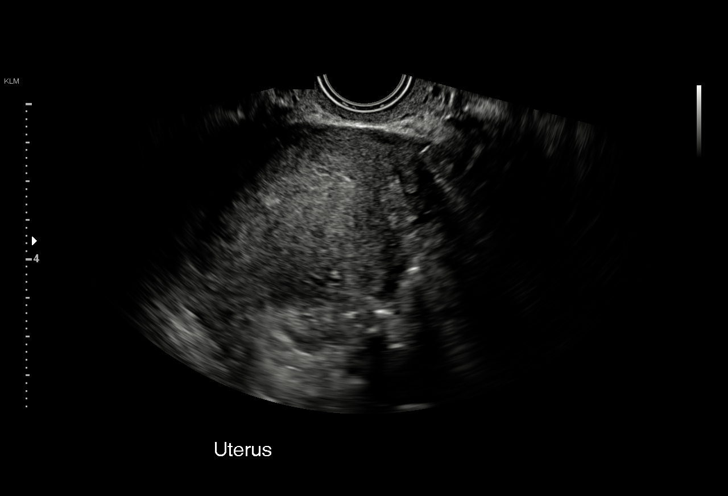
[im 24/46]
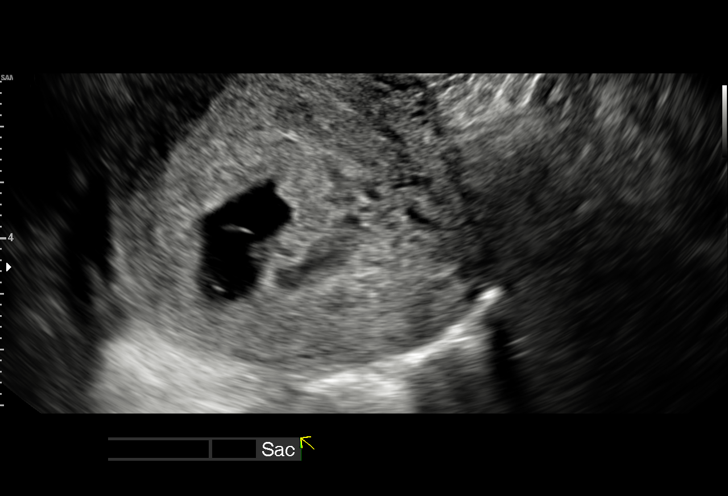
[im 26/46]
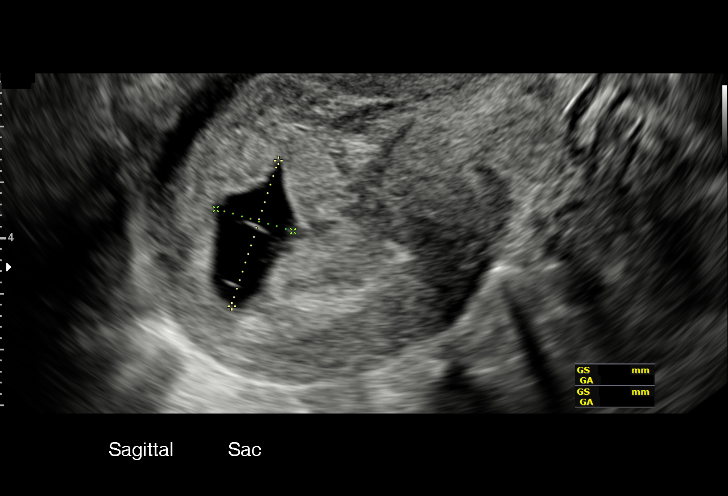
[im 29/46]
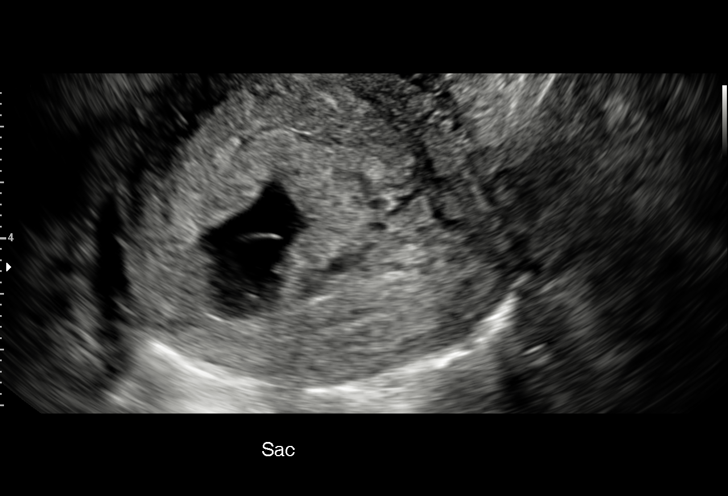
[im 32/46]
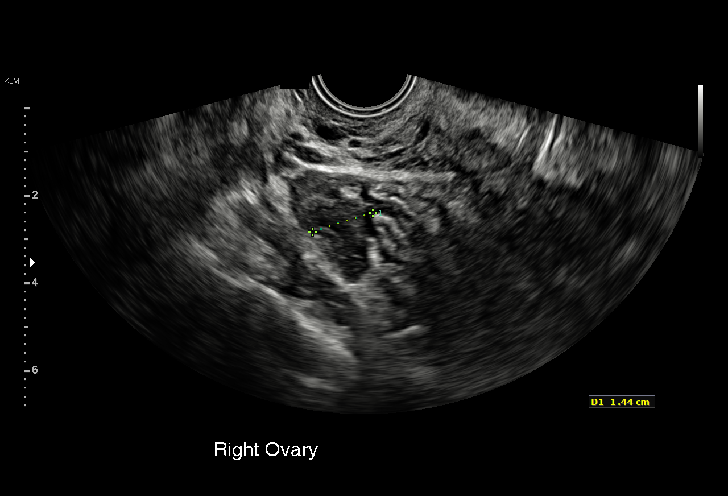
[im 36/46]
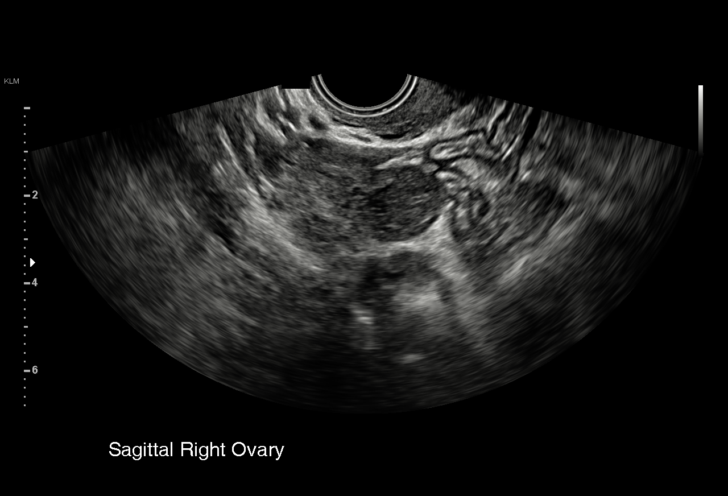
[im 39/46]
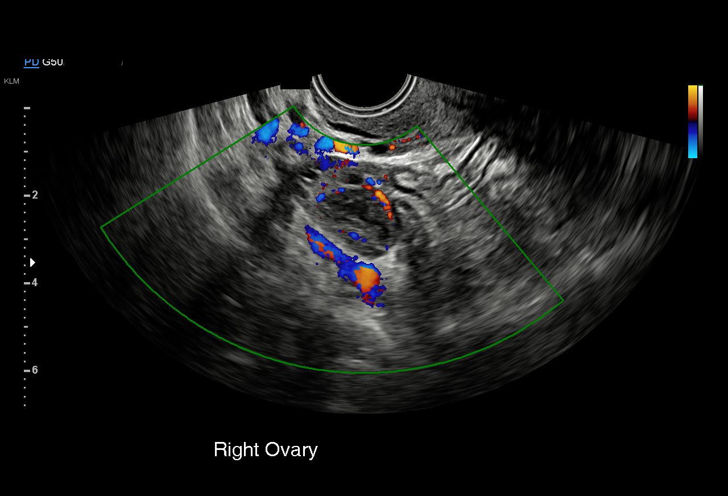
[im 42/46]
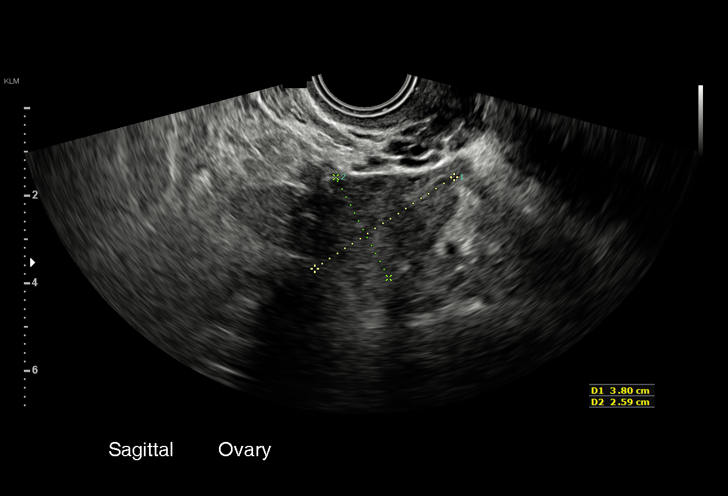
[im 46/46]
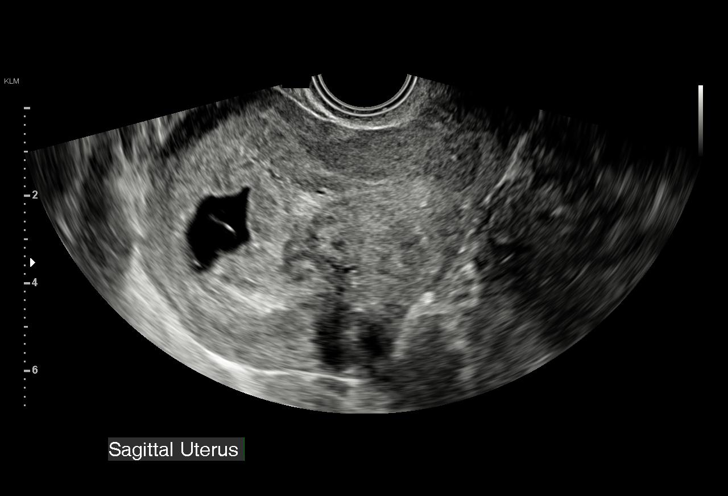

[15 of 28 positions shown; findings below may reference images not displayed]

FINDINGS: Intrauterine gestational sac: Present, single, irregular

Yolk sac:  Present, question enlarged for size of gestational sac

Embryo:  Not identified

Cardiac Activity: N/A

Heart Rate: N/A  bpm

MSD: 21.5 mm   7 w   0 d

CRL:    mm    w    d                  US EDC:

Subchorionic hemorrhage:  None visualized.

Maternal uterus/adnexae:

RIGHT ovary measures 4.2 x 2.2 x 2.0 cm and contains a small
hemorrhagic corpus luteum.

LEFT ovary normal size and morphology, 3.8 x 2.6 x 2.2 cm.

No free pelvic fluid or adnexal masses.
IMPRESSION: Single gestational sac identified within the uterus though the
gestational sac appears irregular and the contained yolk sac appears
enlarged for size of gestational sac.

No fetal pole identified.

Findings are suspicious but not yet definitive for failed pregnancy.
Recommend follow-up US in 10-14 days for definitive diagnosis. This
recommendation follows SRU consensus guidelines: Diagnostic Criteria
for Nonviable Pregnancy Early in the First Trimester. N Engl J Med

## 2022-03-15 IMAGING — US US OB TRANSVAGINAL
1 series · 15 of 28 positions shown · non-contrast
Comparison: Ultrasound 08/16/2019

CLINICAL DATA: Bleeding, known spontaneous abortion

EXAM:
OBSTETRIC <14 WK ULTRASOUND
TECHNIQUE: Transabdominal ultrasound was performed for evaluation of the
gestation as well as the maternal uterus and adnexal regions.

[Series 1: us ob transvaginal · 15 of 47 slices shown]
[im 1/47]
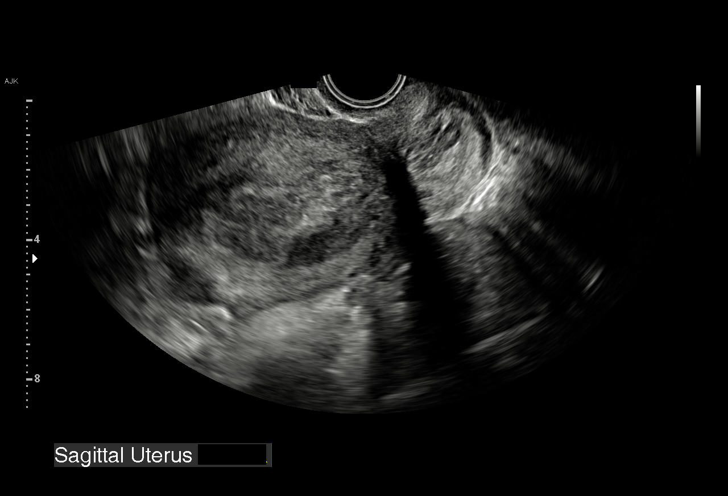
[im 4/47]
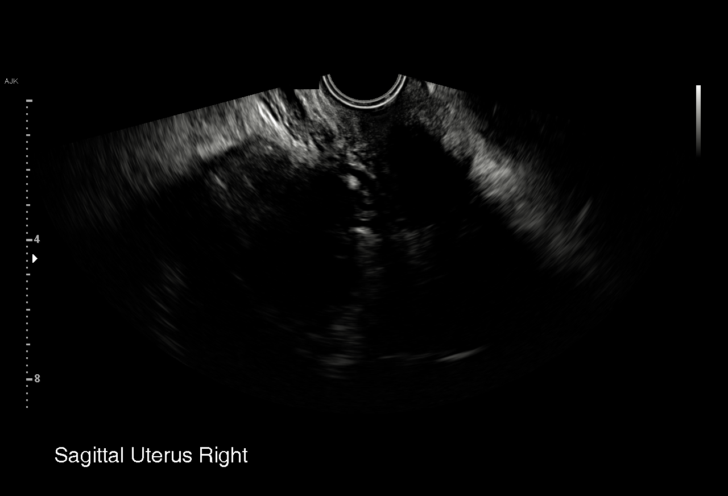
[im 7/47]
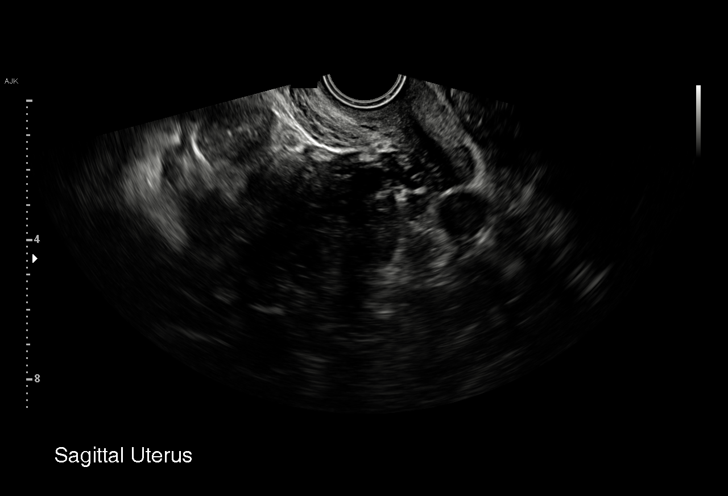
[im 11/47]
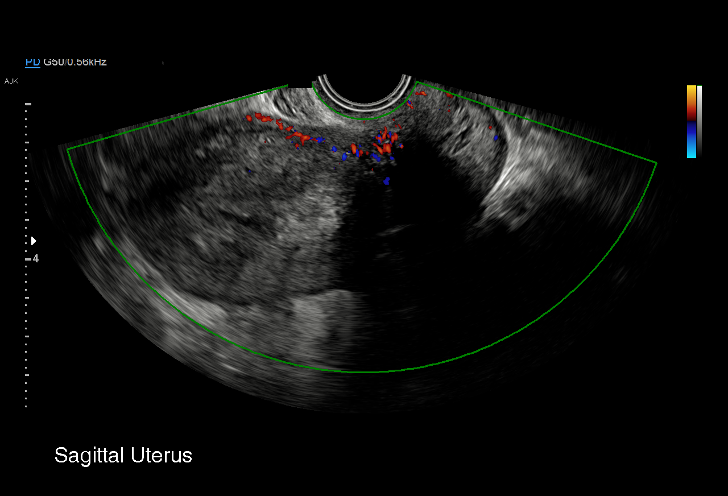
[im 14/47]
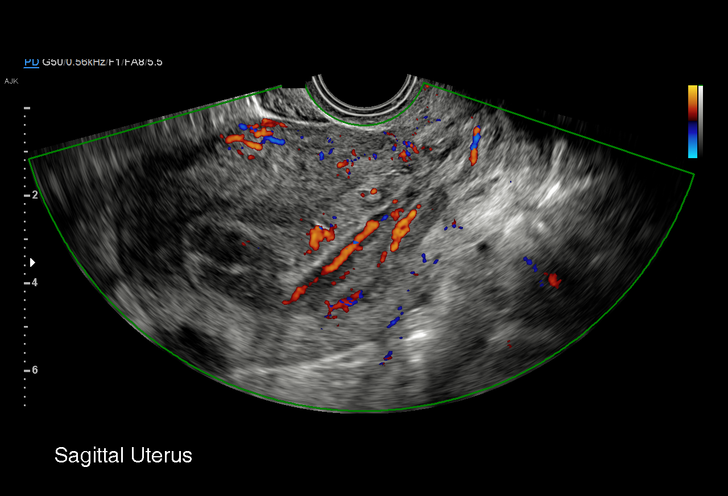
[im 18/47]
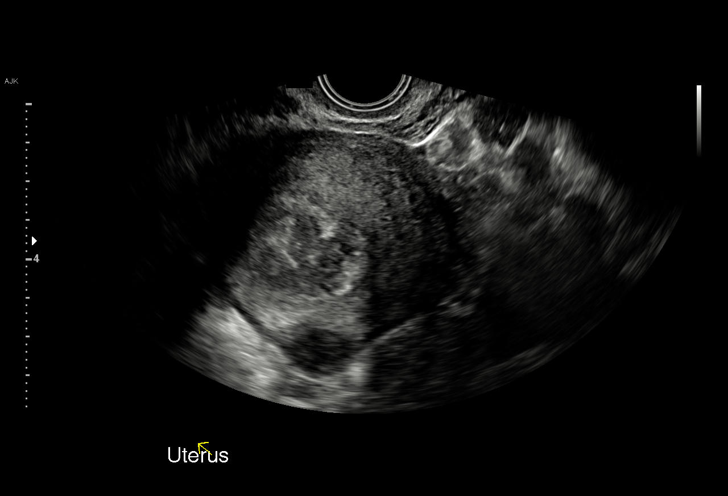
[im 21/47]
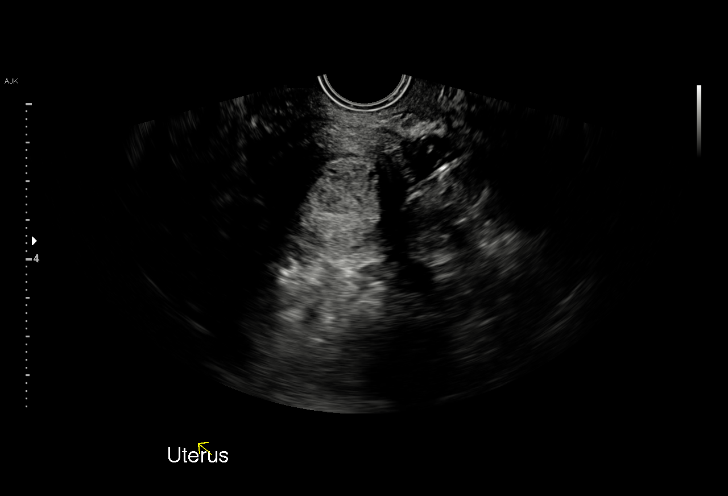
[im 24/47]
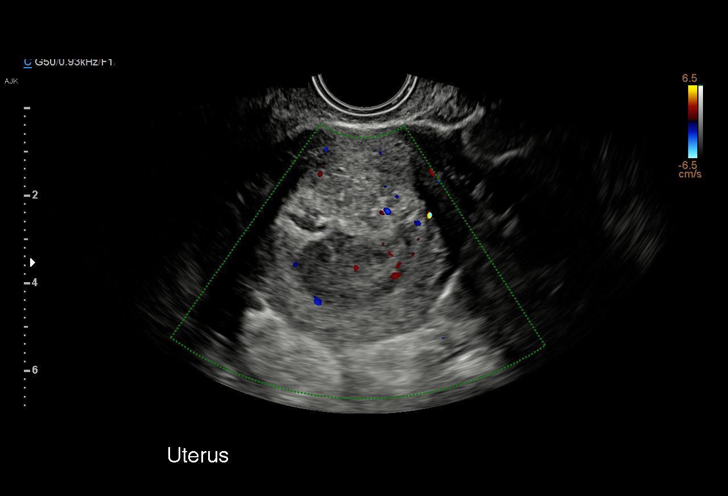
[im 26/47]
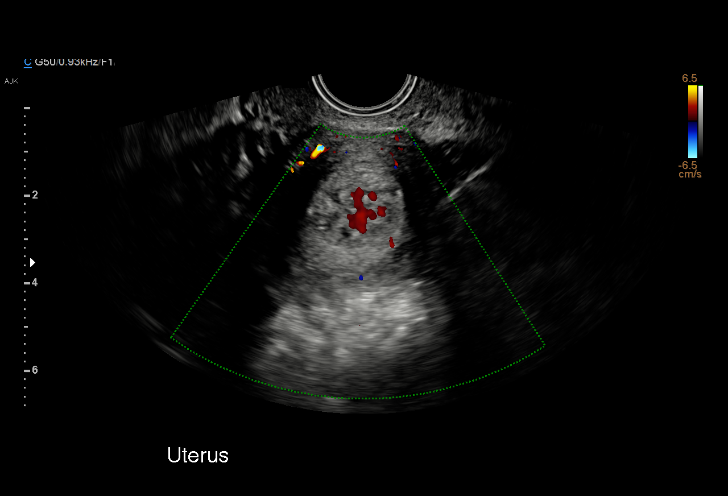
[im 29/47]
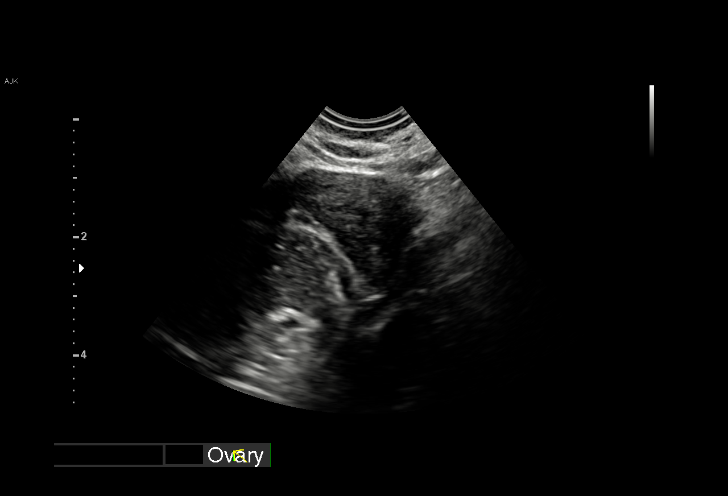
[im 33/47]
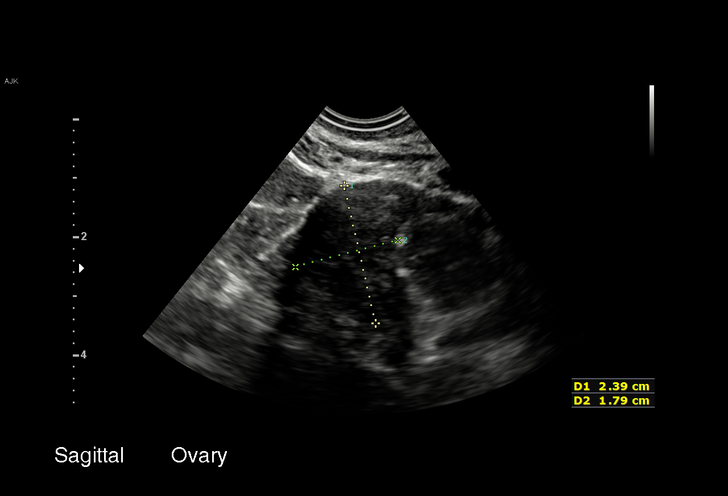
[im 36/47]
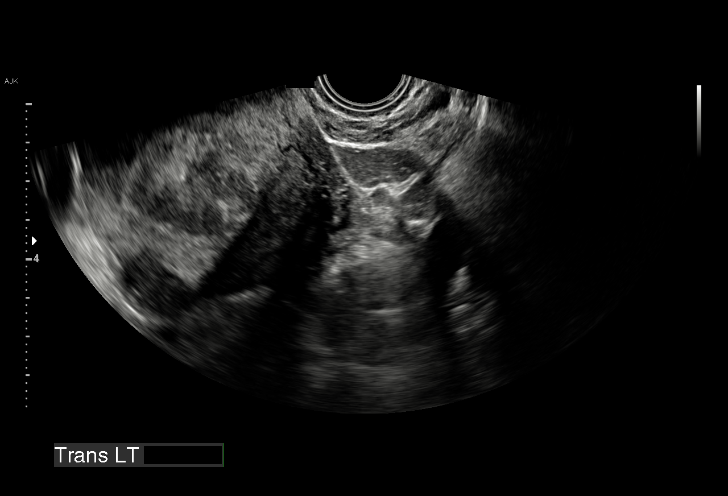
[im 40/47]
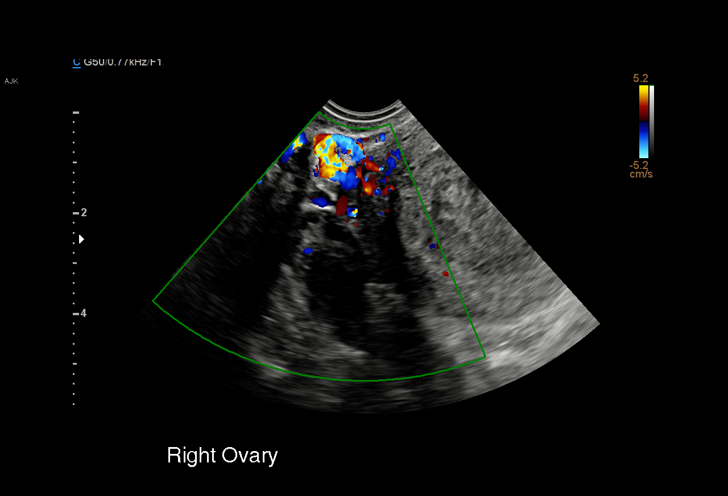
[im 43/47]
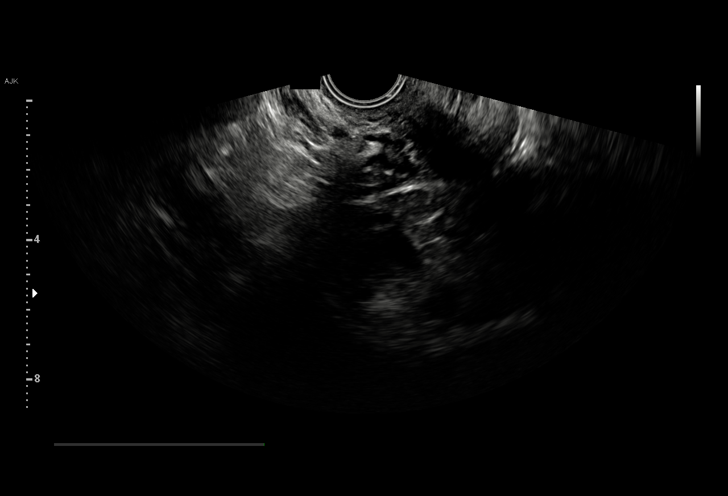
[im 47/47]
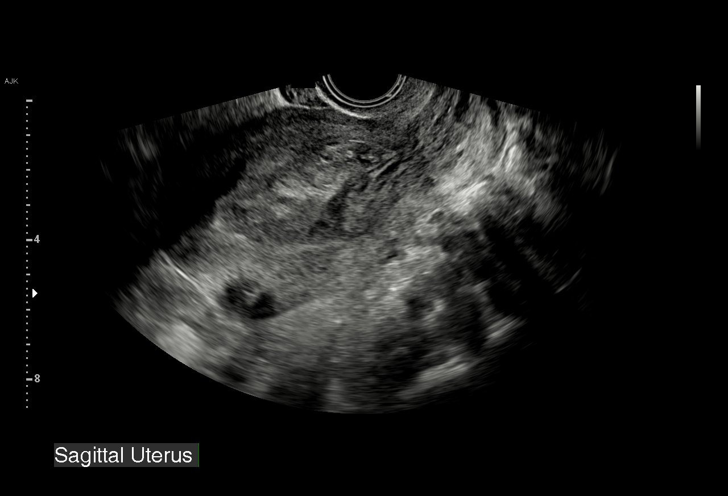

[15 of 28 positions shown; findings below may reference images not displayed]

FINDINGS: Intrauterine gestational sac: None

Yolk sac:  Not Visualized.

Embryo:  Not Visualized.

Cardiac Activity: Not Visualized.

Maternal uterus/adnexae: There is persistent thickening and
heterogeneity of the endometrium which measures up to 18 mm at the
uterine fundus with some internal color vascularity. A hypoechoic
posterior fundal fibroid is noted as well measuring 1.7 x 1.2 x
cm. No concerning adnexal lesions. Right ovary measures 2.6 x 1.6 x
1.5 cm, left ovary measures 2.4 x 1.8 x 2.0 cm. No free fluid.
IMPRESSION: Absence of the previously seen gestational sac is compatible with
reportedly known spontaneous abortion.

Endometrial thickness with vascularity on color Doppler are findings
compatible with retained products of conception in the appropriate
clinical setting.

Fibroid uterus.

## 2022-11-25 ENCOUNTER — Emergency Department (HOSPITAL_BASED_OUTPATIENT_CLINIC_OR_DEPARTMENT_OTHER)
Admission: EM | Admit: 2022-11-25 | Discharge: 2022-11-25 | Disposition: A | Discharge status: Home / Self Care | Payer: BC Managed Care – PPO | Attending: Emergency Medicine | Admitting: Emergency Medicine

## 2022-11-25 DIAGNOSIS — K645 Perianal venous thrombosis: Secondary | ICD-10-CM | POA: Insufficient documentation

## 2022-11-25 NOTE — ED Triage Notes (Signed)
HX hemorrhoids. One popped today. 1500. Started bleeding around 1630. Has been controlled with one liner since. Difficult to sit down.

## 2022-11-25 NOTE — ED Notes (Signed)
Pt has a hx of hemorrhoidsx 5 years.  She has felt a throbbing pain all weekend and states one "burst" this afternoon around 3pm. Bleeding is controlled, but continues to bleed.   Pt does have a hx of constipation due to iron po supplements which they have recently increased to 2 /day Pt states she has started back on Miralax.

## 2022-11-25 NOTE — Discharge Instructions (Addendum)
Apply either hydrocortisone cream or hydrocortisone ointment twice a day.  Long-term, you need to prevent constipation.  It is safe to take polyethylene glycol (MiraLAX) every day, and adjust the dose until you get the appropriate response.

## 2022-11-25 NOTE — ED Provider Notes (Signed)
[place] EMERGENCY DEPARTMENT AT [facility] Provider Note   CSN: [id number] Arrival date & time: 11/25/22  4098     History  Chief Complaint  Patient presents with   Hemorrhoids    Patricia Wilkinson is a 32 y.o. female.  The history is provided by the patient.  She thinks that she had a hemorrhoid and is concerned that it ruptured.  She started having some pain and swelling in the anal area 2 days ago.  She tried applying Preparation H.  Today, she felt a pop and noted something that looked like a squash blueberry.  She continues to have pain and a sense of fullness.  She is concerned because she had what she felt was a lot of blood in the toilet bowl and was concerned that she was having excessive bleeding.   Home Medications Prior to Admission medications   Medication Sig Start Date End Date Taking? Authorizing Provider  famotidine (PEPCID) 20 MG tablet Take 1 tablet (20 mg total) by mouth 2 (two) times daily. 05/24/20   Lorre Nick, MD  hydrocortisone (ANUCORT-HC) 25 MG suppository Place 1 suppository (25 mg total) rectally 2 (two) times daily. 01/28/22   Karie Mainland, Amjad, PA-C  naproxen (NAPROSYN) 500 MG tablet Take 1 tablet (500 mg total) by mouth 2 (two) times daily. 05/23/20   Sabas Sous, MD  phentermine 37.5 MG capsule Take 37.5 mg by mouth daily. 01/16/22   [provider]  predniSONE (STERAPRED UNI-PAK 21 TAB) 10 MG (21) TBPK tablet Take by mouth daily. Take 6 tabs by mouth daily  for 2 days, then 5 tabs for 2 days, then 4 tabs for 2 days, then 3 tabs for 2 days, 2 tabs for 2 days, then 1 tab by mouth daily for 2 days 05/24/20   Lorre Nick, MD  Prenatal Vit-Fe Fumarate-FA (MULTIVITAMIN-PRENATAL) 27-0.8 MG TABS tablet Take 1 tablet by mouth daily at 12 noon.    [provider]      Allergies    Patient has no known allergies.    Review of Systems   Review of Systems  All other systems reviewed and are negative.   Physical Exam Updated Vital  Signs BP 104/71   Pulse 68   Temp 98.8 F (37.1 C)   Resp 18   SpO2 100%  Physical Exam Vitals and nursing note reviewed. Exam conducted with a chaperone present.   32 year old female, resting comfortably and in no acute distress. Vital signs are normal. Oxygen saturation is 100%, which is normal. Head is normocephalic and atraumatic. PERRLA, EOMI. Lungs are clear without rales, wheezes, or rhonchi. Chest is nontender. Heart has regular rate and rhythm without murmur. Abdomen is soft, flat, nontender. Rectal: External hemorrhoids present with a clot having been extruded from one of the hemorrhoids.  Small break in the skin of the hemorrhoid is noted. Skin is warm and dry without rash. Neurologic: Mental status is normal, cranial nerves are intact, moves all extremities equally.  ED Results / Procedures / Treatments    Procedures Procedures    Medications Ordered in ED Medications - No data to display  ED Course/ Medical Decision Making/ A&P                                 Medical Decision Making  Thrombosed external hemorrhoid with spontaneous rupture.  She initially had noted a clot, and a second clot  as spontaneously been extruded.  No residual thrombosed hemorrhoid identified.  I have advised her to use over-the-counter hydrocortisone either cream or ointment.  Long-term, I recommended appropriate bowel regimen including fiber and polyethylene glycol.  I have reviewed her past records, and she does have a prior ED visit on 01/27/2022 for external hemorrhoids.  Final Clinical Impression(s) / ED Diagnoses Final diagnoses:  Thrombosed external hemorrhoid    Rx / DC Orders ED Discharge Orders     None         Dione Booze, MD 11/25/22 2317

## 2023-05-25 ENCOUNTER — Encounter (HOSPITAL_BASED_OUTPATIENT_CLINIC_OR_DEPARTMENT_OTHER): Payer: Self-pay | Admitting: Emergency Medicine

## 2023-05-25 ENCOUNTER — Other Ambulatory Visit: Payer: Self-pay

## 2023-05-25 ENCOUNTER — Emergency Department (HOSPITAL_BASED_OUTPATIENT_CLINIC_OR_DEPARTMENT_OTHER)
Admission: EM | Admit: 2023-05-25 | Discharge: 2023-05-25 | Disposition: A | Discharge status: Home / Self Care | Attending: Emergency Medicine | Admitting: Emergency Medicine

## 2023-05-25 DIAGNOSIS — J069 Acute upper respiratory infection, unspecified: Secondary | ICD-10-CM | POA: Insufficient documentation

## 2023-05-25 DIAGNOSIS — R059 Cough, unspecified: Secondary | ICD-10-CM | POA: Diagnosis present

## 2023-05-25 LAB — RESP PANEL BY RT-PCR (RSV, FLU A&B, COVID)  RVPGX2
Influenza A by PCR: NEGATIVE
Influenza B by PCR: NEGATIVE
Resp Syncytial Virus by PCR: NEGATIVE
SARS Coronavirus 2 by RT PCR: NEGATIVE

## 2023-05-25 MED ORDER — BENZONATATE 100 MG PO CAPS: 100.0000 mg | ORAL_CAPSULE | Freq: Three times a day (TID) | ORAL | 0 refills | Status: AC

## 2023-05-25 NOTE — ED Notes (Signed)

## 2023-05-25 NOTE — ED Triage Notes (Signed)
 Cough x 2 day Coughing up phlegm

## 2023-05-25 NOTE — ED Provider Notes (Signed)
 [place] EMERGENCY DEPARTMENT AT [facility] Provider Note   CSN: [id number] Arrival date & time: 05/25/23  1125     History  Chief Complaint  Patient presents with   Cough    Patricia Wilkinson is a 33 y.o. female.  Presents the emergency department concerns of a cough for about 2 days.  Endorse that she has been coughing up green phlegm.  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  No significant medical history.  Recently started taking Mucinex daily and feels that this is helping her clear the congestion from her chest.  No recent fever chills or bodyaches.   Cough      Home Medications Prior to Admission medications   Medication Sig Start Date End Date Taking? Authorizing Provider  benzonatate (TESSALON) 100 MG capsule Take 1 capsule (100 mg total) by mouth every 8 (eight) hours. 05/25/23  Yes Smitty Knudsen, PA-C  famotidine (PEPCID) 20 MG tablet Take 1 tablet (20 mg total) by mouth 2 (two) times daily. 05/24/20   Lorre Nick, MD  hydrocortisone (ANUCORT-HC) 25 MG suppository Place 1 suppository (25 mg total) rectally 2 (two) times daily. 01/28/22   Karie Mainland, Amjad, PA-C  naproxen (NAPROSYN) 500 MG tablet Take 1 tablet (500 mg total) by mouth 2 (two) times daily. 05/23/20   Sabas Sous, MD  phentermine 37.5 MG capsule Take 37.5 mg by mouth daily. 01/16/22   [provider]  Prenatal Vit-Fe Fumarate-FA (MULTIVITAMIN-PRENATAL) 27-0.8 MG TABS tablet Take 1 tablet by mouth daily at 12 noon.    [provider]      Allergies    Patient has no known allergies.    Review of Systems   Review of Systems  Respiratory:  Positive for cough.   All other systems reviewed and are negative.   Physical Exam Updated Vital Signs BP 119/78 (BP Location: Right Arm)   Pulse 98   Temp 98.7 F (37.1 C)   Resp 16   SpO2 99%  Physical Exam Vitals and nursing note reviewed.  Constitutional:      General: She is  not in acute distress.    Appearance: Normal appearance. She is well-developed.  HENT:     Head: Normocephalic and atraumatic.  Eyes:     Conjunctiva/sclera: Conjunctivae normal.  Cardiovascular:     Rate and Rhythm: Normal rate and regular rhythm.     Heart sounds: No murmur heard. Pulmonary:     Effort: Pulmonary effort is normal. No respiratory distress.     Breath sounds: Normal breath sounds. No wheezing, rhonchi or rales.  Abdominal:     Palpations: Abdomen is soft.     Tenderness: There is no abdominal tenderness.  Musculoskeletal:        General: No swelling.     Cervical back: Neck supple.  Skin:    General: Skin is warm and dry.     Capillary Refill: Capillary refill takes less than 2 seconds.  Neurological:     Mental Status: She is alert.  Psychiatric:        Mood and Affect: Mood normal.     ED Results / Procedures / Treatments   Labs (all labs ordered are listed, but only abnormal results are displayed) Labs Reviewed  RESP PANEL BY RT-PCR (RSV, FLU A&B, COVID)  RVPGX2    EKG None  Radiology No results found.  Procedures Procedures    Medications Ordered in ED  Medications - No data to display  ED Course/ Medical Decision Making/ A&P                                 Medical Decision Making  This patient presents to the ED for concern of cough.  Differential diagnosis includes COVID-19, influenza, pneumonia, bronchitis   Lab Tests:  I Ordered, and personally interpreted labs.  The pertinent results include: Respiratory panel negative   Problem List / ED Course:  Patient presents the ED with concerns of a cough.  Reports has been ongoing for about 2 days.  Denies any recent fever chills or bodyaches.  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  Started taking Mucinex earlier today with some improvement in symptoms. Physical exam is reassuring without any acute findings.  No evidence of rales  present.  No wheezing or rhonchi.  Patient otherwise well-appearing with no signs of respiratory distress. Given reassuring workup and negative x-ray panel, I suspect that this is likely a viral process although unclear.  This does not appear to be pneumonia given unremarkable lung sounds.  Given also showed progression of symptoms only ongoing for about 3 days, also doubtful pneumonia.  Will discharge home with antitussive medications advise close follow-up with PCP.  Return precautions discussed.  Patient discharged home in stable condition.  Final Clinical Impression(s) / ED Diagnoses Final diagnoses:  Viral URI with cough    Rx / DC Orders ED Discharge Orders          Ordered    benzonatate (TESSALON) 100 MG capsule  Every 8 hours        05/25/23 1509              Smitty Knudsen, PA-C 05/25/23 1510    Tegeler, Canary Brim, MD 05/26/23 [phone]

## 2023-05-25 NOTE — Discharge Instructions (Signed)
 You were seen in the emergency department today for concerns of a cough.  You are negative for COVID-19, influenza and RSV.  I do suspect you likely have a viral infection causing your symptoms.  I would recommend continue taking Mucinex as this can help with the chest congestion you been feeling.  For any concerns of development of low-grade fever and shortness of breath, return the emergency department.
